# Patient Record
Sex: Female | Born: 1965 | Race: White | Hispanic: No | Marital: Married | Smoking: Former smoker
Health system: Southern US, Community
[De-identification: ages and names within clinical notes are randomized; demographics above are authoritative.]

## PROBLEM LIST (undated history)

## (undated) DIAGNOSIS — F329 Major depressive disorder, single episode, unspecified: Secondary | ICD-10-CM

## (undated) DIAGNOSIS — Z8041 Family history of malignant neoplasm of ovary: Secondary | ICD-10-CM

## (undated) DIAGNOSIS — Z8639 Personal history of other endocrine, nutritional and metabolic disease: Secondary | ICD-10-CM

## (undated) DIAGNOSIS — F32A Depression, unspecified: Secondary | ICD-10-CM

## (undated) DIAGNOSIS — K219 Gastro-esophageal reflux disease without esophagitis: Secondary | ICD-10-CM

## (undated) DIAGNOSIS — G43909 Migraine, unspecified, not intractable, without status migrainosus: Secondary | ICD-10-CM

## (undated) DIAGNOSIS — Z1371 Encounter for nonprocreative screening for genetic disease carrier status: Secondary | ICD-10-CM

## (undated) DIAGNOSIS — K589 Irritable bowel syndrome without diarrhea: Secondary | ICD-10-CM

## (undated) DIAGNOSIS — F419 Anxiety disorder, unspecified: Secondary | ICD-10-CM

## (undated) DIAGNOSIS — K259 Gastric ulcer, unspecified as acute or chronic, without hemorrhage or perforation: Secondary | ICD-10-CM

## (undated) DIAGNOSIS — K649 Unspecified hemorrhoids: Secondary | ICD-10-CM

## (undated) DIAGNOSIS — N920 Excessive and frequent menstruation with regular cycle: Secondary | ICD-10-CM

## (undated) DIAGNOSIS — D259 Leiomyoma of uterus, unspecified: Secondary | ICD-10-CM

## (undated) DIAGNOSIS — M5416 Radiculopathy, lumbar region: Secondary | ICD-10-CM

## (undated) HISTORY — PX: OOPHORECTOMY: SHX86

## (undated) HISTORY — DX: Radiculopathy, lumbar region: M54.16

## (undated) HISTORY — DX: Excessive and frequent menstruation with regular cycle: N92.0

## (undated) HISTORY — DX: Encounter for nonprocreative screening for genetic disease carrier status: Z13.71

## (undated) HISTORY — PX: COLONOSCOPY: SHX174

## (undated) HISTORY — DX: Irritable bowel syndrome, unspecified: K58.9

## (undated) HISTORY — DX: Family history of malignant neoplasm of ovary: Z80.41

## (undated) HISTORY — DX: Major depressive disorder, single episode, unspecified: F32.9

## (undated) HISTORY — DX: Personal history of other endocrine, nutritional and metabolic disease: Z86.39

## (undated) HISTORY — DX: Gastro-esophageal reflux disease without esophagitis: K21.9

## (undated) HISTORY — DX: Unspecified hemorrhoids: K64.9

## (undated) HISTORY — PX: ECTOPIC PREGNANCY SURGERY: SHX613

## (undated) HISTORY — DX: Leiomyoma of uterus, unspecified: D25.9

## (undated) HISTORY — DX: Depression, unspecified: F32.A

## (undated) HISTORY — PX: ABDOMINAL HYSTERECTOMY: SHX81

## (undated) HISTORY — DX: Migraine, unspecified, not intractable, without status migrainosus: G43.909

## (undated) HISTORY — DX: Anxiety disorder, unspecified: F41.9

## (undated) HISTORY — DX: Gastric ulcer, unspecified as acute or chronic, without hemorrhage or perforation: K25.9

---

## 2002-03-25 HISTORY — PX: TUBAL LIGATION: SHX77

## 2004-01-03 ENCOUNTER — Ambulatory Visit: Payer: Self-pay | Admitting: Gastroenterology

## 2007-03-12 ENCOUNTER — Ambulatory Visit: Payer: Self-pay

## 2007-03-16 ENCOUNTER — Emergency Department: Payer: Self-pay | Admitting: Internal Medicine

## 2007-05-03 ENCOUNTER — Ambulatory Visit: Payer: Self-pay | Admitting: Family Medicine

## 2007-06-05 ENCOUNTER — Ambulatory Visit: Payer: Self-pay | Admitting: Internal Medicine

## 2007-08-22 ENCOUNTER — Ambulatory Visit: Payer: Self-pay | Admitting: Family Medicine

## 2007-12-23 ENCOUNTER — Ambulatory Visit: Payer: Self-pay | Admitting: Family Medicine

## 2008-01-31 ENCOUNTER — Ambulatory Visit: Payer: Self-pay | Admitting: Family Medicine

## 2008-02-07 ENCOUNTER — Ambulatory Visit: Payer: Self-pay | Admitting: Family Medicine

## 2008-02-07 ENCOUNTER — Emergency Department: Payer: Self-pay | Admitting: Emergency Medicine

## 2008-03-25 HISTORY — PX: BACK SURGERY: SHX140

## 2008-03-25 HISTORY — PX: CHOLECYSTECTOMY: SHX55

## 2008-04-11 ENCOUNTER — Ambulatory Visit: Payer: Self-pay | Admitting: General Surgery

## 2008-04-18 ENCOUNTER — Ambulatory Visit: Payer: Self-pay | Admitting: General Surgery

## 2008-05-15 ENCOUNTER — Ambulatory Visit: Payer: Self-pay | Admitting: Internal Medicine

## 2008-11-25 ENCOUNTER — Ambulatory Visit: Payer: Self-pay | Admitting: Internal Medicine

## 2009-04-29 ENCOUNTER — Ambulatory Visit: Payer: Self-pay | Admitting: Internal Medicine

## 2009-05-02 ENCOUNTER — Ambulatory Visit: Payer: Self-pay | Admitting: Internal Medicine

## 2009-08-10 ENCOUNTER — Ambulatory Visit: Payer: Self-pay | Admitting: Internal Medicine

## 2009-09-15 ENCOUNTER — Ambulatory Visit: Payer: Self-pay | Admitting: Internal Medicine

## 2010-06-23 ENCOUNTER — Ambulatory Visit: Payer: Self-pay | Admitting: Internal Medicine

## 2012-02-08 ENCOUNTER — Ambulatory Visit: Payer: Self-pay | Admitting: Physician Assistant

## 2012-02-08 LAB — RAPID STREP-A WITH REFLX: Micro Text Report: NEGATIVE

## 2012-03-25 HISTORY — PX: TOTAL LAPAROSCOPIC HYSTERECTOMY WITH SALPINGECTOMY: SHX6742

## 2012-04-12 ENCOUNTER — Ambulatory Visit: Payer: Self-pay | Admitting: Physician Assistant

## 2012-04-12 LAB — RAPID STREP-A WITH REFLX: Micro Text Report: NEGATIVE

## 2012-04-12 LAB — RAPID INFLUENZA A&B ANTIGENS

## 2012-04-18 ENCOUNTER — Ambulatory Visit: Payer: Self-pay | Admitting: Emergency Medicine

## 2012-07-03 ENCOUNTER — Ambulatory Visit: Payer: Self-pay | Admitting: Family Medicine

## 2013-01-25 ENCOUNTER — Ambulatory Visit: Payer: Self-pay | Admitting: Obstetrics & Gynecology

## 2013-01-25 LAB — BASIC METABOLIC PANEL
Calcium, Total: 8.8 mg/dL (ref 8.5–10.1)
Co2: 29 mmol/L (ref 21–32)
EGFR (African American): >60
EGFR (Non-African Amer.): >60
Osmolality: 272 (ref 275–301)
Potassium: 3.8 mmol/L (ref 3.5–5.1)

## 2013-01-25 LAB — PROTIME-INR
INR: 1
Prothrombin Time: 13.7 secs (ref 11.5–14.7)

## 2013-01-25 LAB — CBC
HCT: 37.7 % (ref 35.0–47.0)
HGB: 12.9 g/dL (ref 12.0–16.0)
MCHC: 34.2 g/dL (ref 32.0–36.0)
Platelet: 332 10*3/uL (ref 150–440)
WBC: 8.7 10*3/uL (ref 3.6–11.0)

## 2013-01-25 LAB — PREGNANCY, URINE: Pregnancy Test, Urine: NEGATIVE m[IU]/mL

## 2013-01-25 LAB — APTT: Activated PTT: 30.9 secs (ref 23.6–35.9)

## 2013-02-09 ENCOUNTER — Ambulatory Visit: Payer: Self-pay | Admitting: Obstetrics & Gynecology

## 2013-02-10 LAB — PATHOLOGY REPORT

## 2013-05-02 ENCOUNTER — Ambulatory Visit: Payer: Self-pay | Admitting: Family Medicine

## 2014-04-06 DIAGNOSIS — IMO0002 Reserved for concepts with insufficient information to code with codable children: Secondary | ICD-10-CM | POA: Insufficient documentation

## 2014-04-06 DIAGNOSIS — G43909 Migraine, unspecified, not intractable, without status migrainosus: Secondary | ICD-10-CM

## 2014-04-06 DIAGNOSIS — G43709 Chronic migraine without aura, not intractable, without status migrainosus: Secondary | ICD-10-CM | POA: Insufficient documentation

## 2014-04-06 HISTORY — DX: Migraine, unspecified, not intractable, without status migrainosus: G43.909

## 2014-05-10 ENCOUNTER — Ambulatory Visit: Payer: Self-pay | Admitting: Obstetrics & Gynecology

## 2014-06-17 DIAGNOSIS — M5416 Radiculopathy, lumbar region: Secondary | ICD-10-CM

## 2014-06-17 HISTORY — DX: Radiculopathy, lumbar region: M54.16

## 2014-07-15 NOTE — Op Note (Signed)
PATIENT NAME:  Katrina Munoz, STOPKA MR#:  [id number] DATE OF BIRTH:  1965-04-22  DATE OF PROCEDURE:  02/09/2013  PREOPERATIVE DIAGNOSES: Fibroid uterus, menorrhagia, and family history of ovarian cancer.   POSTOPERATIVE DIAGNOSES: Fibroid uterus, menorrhagia, and family history of ovarian cancer.   PROCEDURES PERFORMED: Complete laparoscopic hysterectomy with bilateral salpingo-oophorectomy and lysis of adhesions.   SURGEON: Glean Salen, M.D.   ASSISTANT: Malachy Mood, M.D.   ANESTHESIA: General.   ESTIMATED BLOOD LOSS: 800 mL.   COMPLICATIONS: None.   FINDINGS: Fibroid uterus. Normal tubes and ovaries. Some adhesions of the omentum to anterior abdominal wall.   DISPOSITION: To recovery room stable.   TECHNIQUE: The patient is prepped and draped in the usual sterile fashion after adequate anesthesia is obtained in the dorsal lithotomy position. Foley catheter is inserted. A V-Care  device is placed on the uterus after sounding to 10 cm. Attention is then turned to the abdomen where a Veress needle is inserted through a 5 mm infraumbilical incision after Marcaine is used to anesthetize the skin. Veress needle placement is confirmed using the hanging drop technique and the abdomen is then insufflated with CO2 gas. A 5 mm trocar is then inserted under direct visualization with the laparoscope with no injuries or bleeding noted. The patient is placed in Trendelenburg positioning and the above-mentioned findings are visualized. An 11 mm trocar is placed in the right lower quadrant and a 5 mm trocar is placed in the left lower quadrant lateral to the inferior epigastric blood vessels with no injuries or bleeding noted.   The infundibulopelvic blood vessels and ligaments are carefully coagulated and cut using the Harmonic scalpel with excellent hemostasis noted. Adhesions of the omentum to the anterior abdominal wall are also dissected and freed using the Harmonic scalpel without significant  bleeding noted. The round ligaments are carefully coagulated and cut using the Harmonic scalpel and then the broad ligament is dissected down to the level of the uterine arteries which are carefully coagulated and cut using the bipolar cautery device. The bladder is somewhat adherent to the cervix and is carefully dissected using the Harmonic scalpel without injury. The ureters are identified and visualized during this dissection of the uterine arteries as well. Once this has been completed on both sides and the uterus is noted to be anemic, the V-Care device is identified at the cervicovaginal junction tenting through the tissues and a circumferential incision is made to excise the uterus and cervix completely. The uterus is then removed vaginally. The pelvic cavity is irrigated with aspiration of fluid and hemostasis obtained using electrocautery. The vaginal cuff is closed with an interrupted 0 Polysorb suture using the Endo Stitch device, 6 sutures are applied and bimanual examination reveals complete closure. Repeat visualization of the intra-abdominal cavity reveals excellent hemostasis with no apparent injury to bowel, bladder, ureter, or colon. The patient is leveled, gas is removed, and trocars are removed with closing of the right lower quadrant incision using a 4-0 Vicryl suture and all incisions using Dermabond.   Cystoscopy is performed with saline distention of the bladder with no apparent injury or laceration. There is bilateral ureteral flow suggesting patent ureters. Cystoscope is removed and Foley catheter is reinserted. The patient goes to the recovery room in stable condition. All sponge, instrument, and needle counts are correct at the conclusion of the case. ____________________________ R. Barnett Applebaum, MD rph:sb D: 02/09/2013 10:09:58 ET T: 02/09/2013 10:25:09 ET JOB#: [id number]  cc: Glean Salen, MD, <Dictator> Herbie Baltimore  Salli Quarry MD ELECTRONICALLY SIGNED 02/11/2013 4:54

## 2014-08-01 DIAGNOSIS — Z8639 Personal history of other endocrine, nutritional and metabolic disease: Secondary | ICD-10-CM

## 2014-08-01 HISTORY — DX: Personal history of other endocrine, nutritional and metabolic disease: Z86.39

## 2014-11-17 IMAGING — CR DG CHEST 2V
1 series · 2 of 2 positions shown · non-contrast
Comparison: none

REASON FOR EXAM: productive cough X 1 week
COMMENTS:

PROCEDURE:     MDR - MDR CHEST PA(OR AP) AND LATERAL  - April 18, 2012 [DATE]
RESULT:     The lungs are clear. The heart and pulmonary vessels are normal.
The bony and mediastinal structures are unremarkable. There is no effusion.
There is no pneumothorax or evidence of congestive failure.

[Series 1: pa · 0.17mm/px · 2 of 2 slices shown]
[im 1/2]
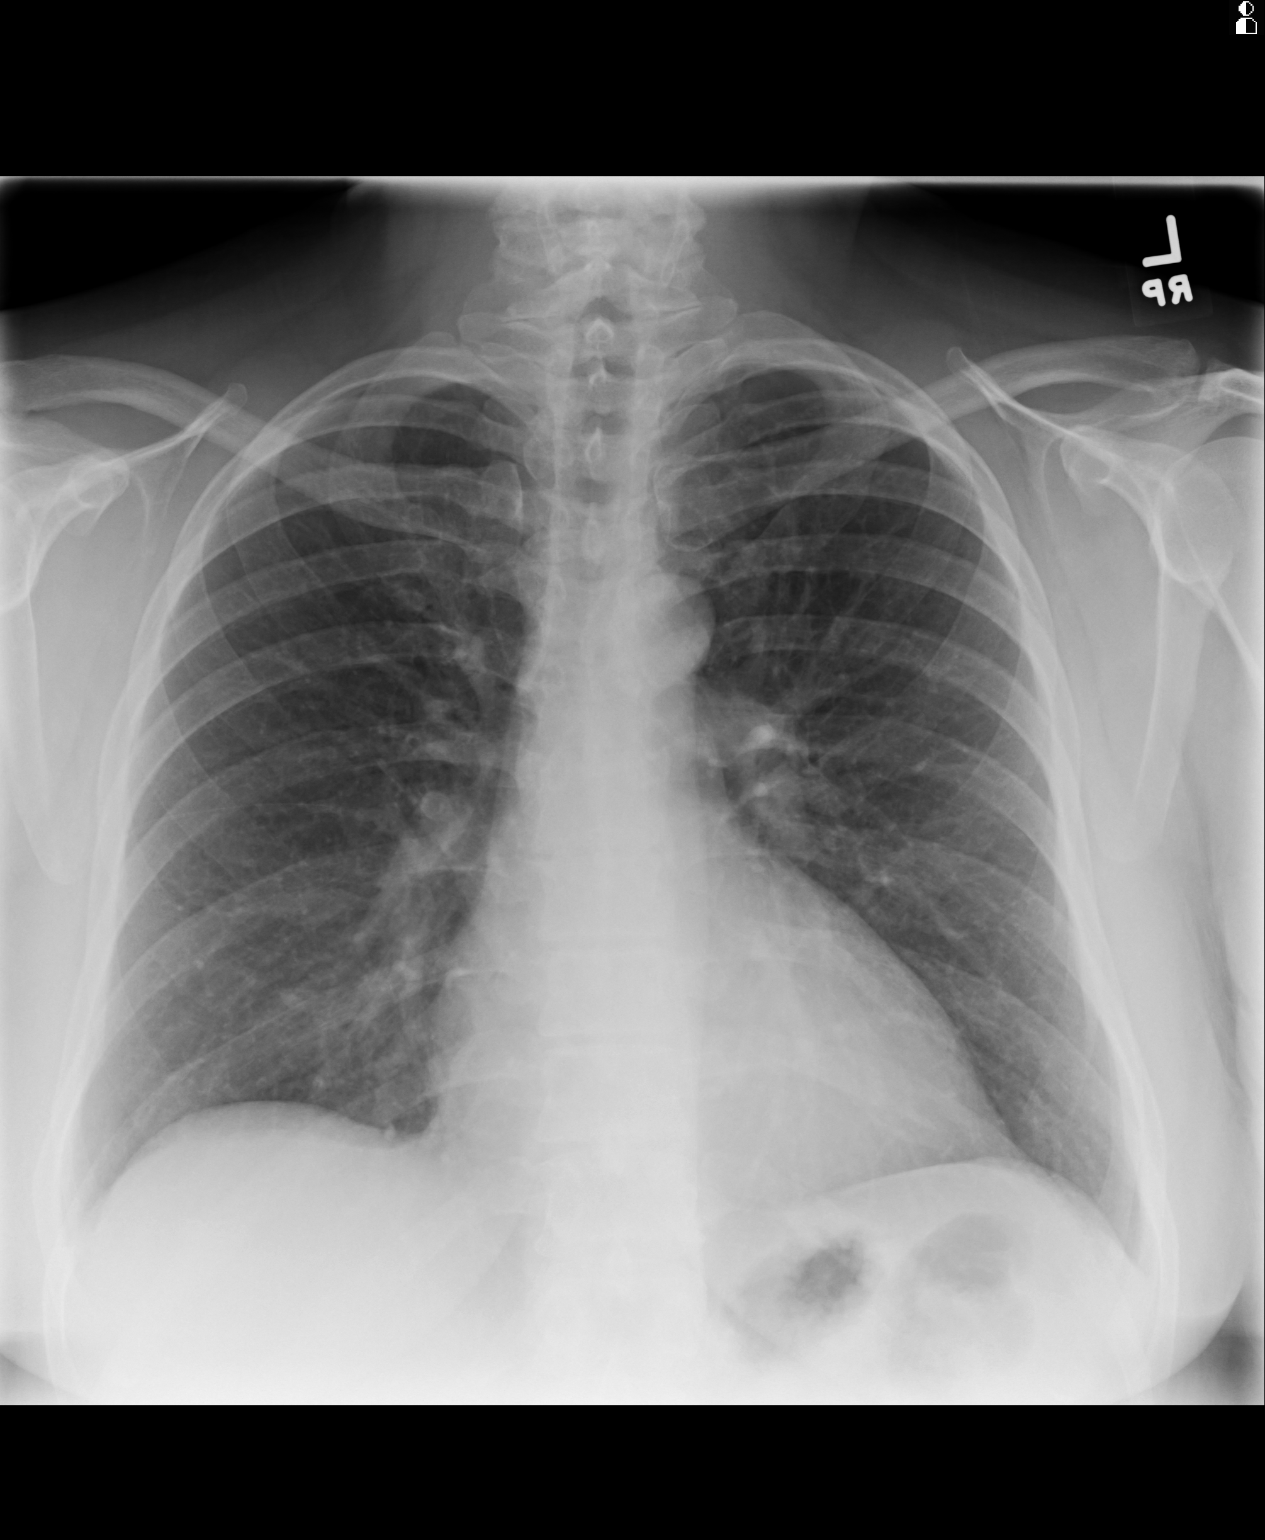
[im 2/2]
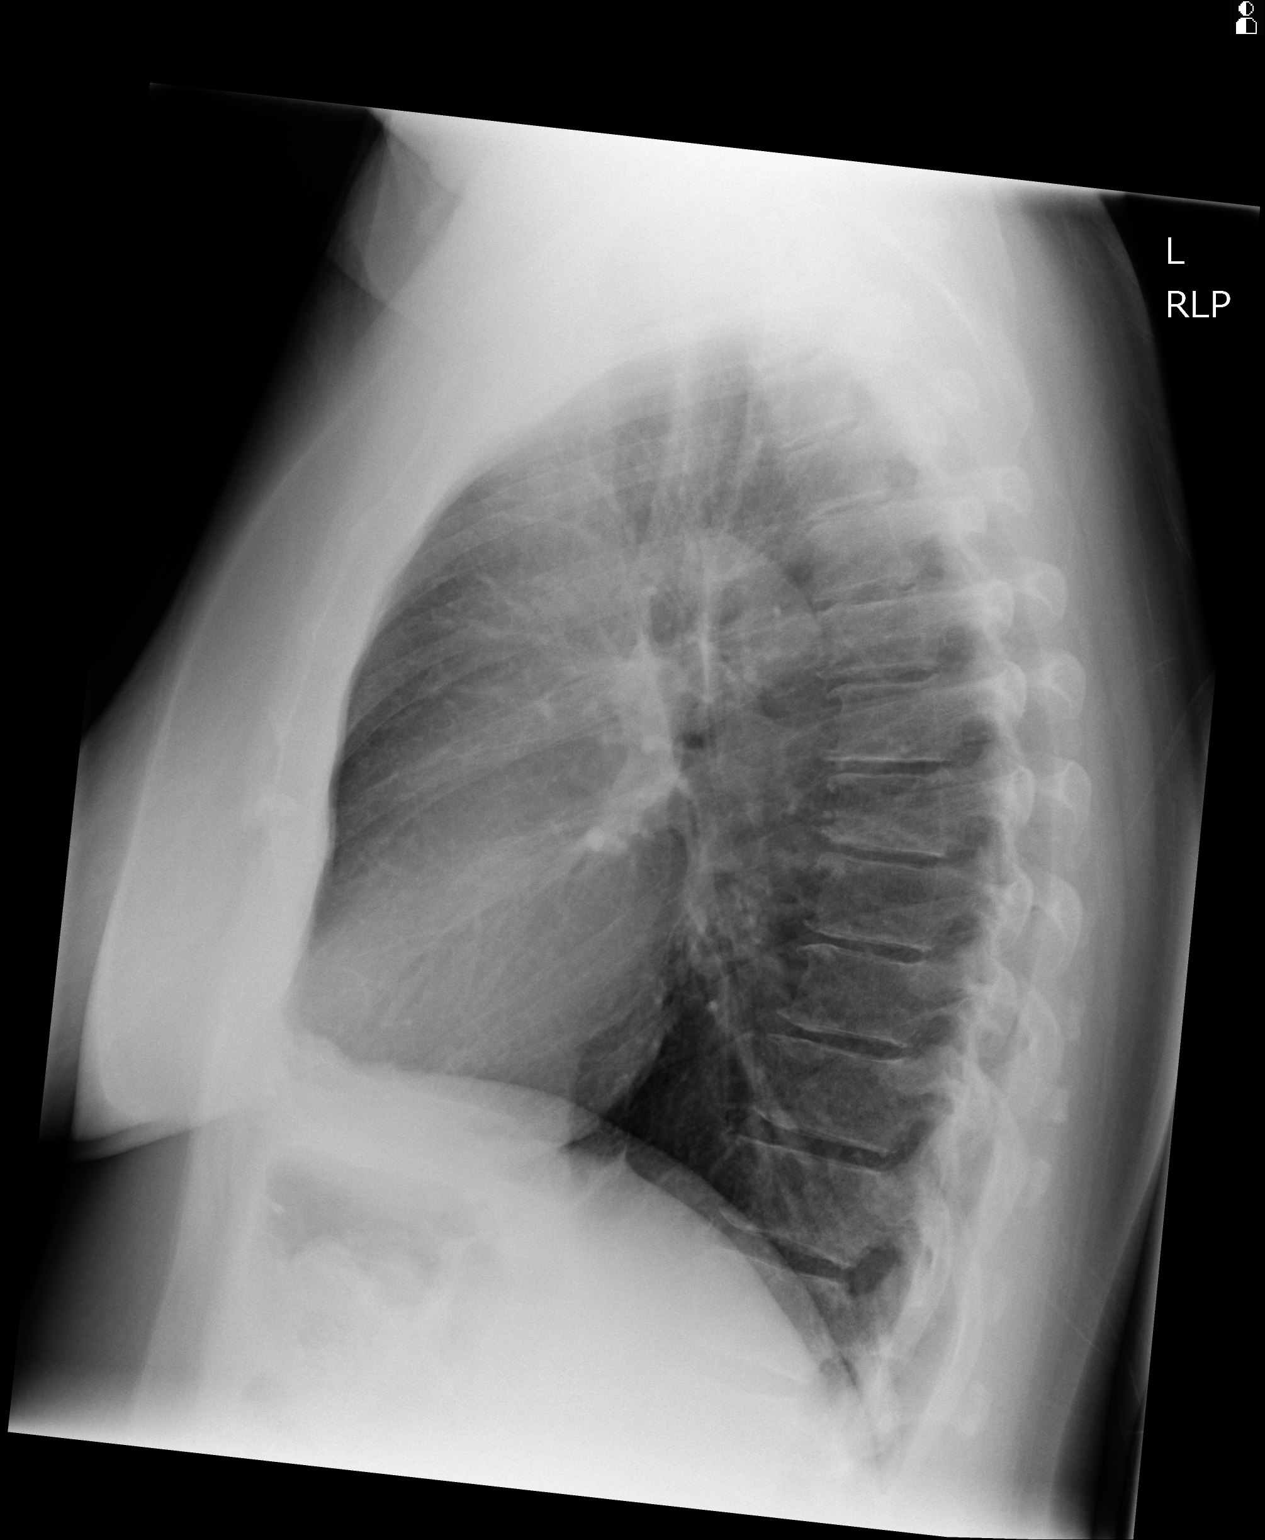

[2 of 2 positions shown; findings below may reference images not displayed]

IMPRESSION: No acute cardiopulmonary disease.

[REDACTED]

## 2015-05-08 ENCOUNTER — Other Ambulatory Visit: Payer: Self-pay | Admitting: Obstetrics & Gynecology

## 2015-05-08 DIAGNOSIS — Z1231 Encounter for screening mammogram for malignant neoplasm of breast: Secondary | ICD-10-CM

## 2015-05-19 ENCOUNTER — Ambulatory Visit: Payer: Self-pay | Admitting: Urology

## 2015-05-25 ENCOUNTER — Ambulatory Visit
Admission: RE | Admit: 2015-05-25 | Discharge: 2015-05-25 | Disposition: A | Discharge status: Home / Self Care | Payer: PRIVATE HEALTH INSURANCE | Source: Ambulatory Visit | Attending: Obstetrics & Gynecology | Admitting: Obstetrics & Gynecology

## 2015-05-25 DIAGNOSIS — Z1231 Encounter for screening mammogram for malignant neoplasm of breast: Secondary | ICD-10-CM | POA: Insufficient documentation

## 2015-05-25 LAB — HM MAMMOGRAPHY

## 2015-05-30 ENCOUNTER — Ambulatory Visit
Admission: RE | Admit: 2015-05-30 | Discharge: 2015-05-30 | Disposition: A | Discharge status: Home / Self Care | Payer: PRIVATE HEALTH INSURANCE | Source: Ambulatory Visit | Attending: Family Medicine | Admitting: Family Medicine

## 2015-05-30 ENCOUNTER — Other Ambulatory Visit: Payer: Self-pay | Admitting: Family Medicine

## 2015-05-30 DIAGNOSIS — M7989 Other specified soft tissue disorders: Secondary | ICD-10-CM | POA: Insufficient documentation

## 2015-05-30 DIAGNOSIS — M25571 Pain in right ankle and joints of right foot: Secondary | ICD-10-CM | POA: Diagnosis not present

## 2015-05-30 DIAGNOSIS — M19071 Primary osteoarthritis, right ankle and foot: Secondary | ICD-10-CM | POA: Insufficient documentation

## 2015-06-19 ENCOUNTER — Encounter: Payer: Self-pay | Admitting: Urology

## 2015-06-19 ENCOUNTER — Ambulatory Visit (INDEPENDENT_AMBULATORY_CARE_PROVIDER_SITE_OTHER): Payer: PRIVATE HEALTH INSURANCE | Admitting: Urology

## 2015-06-19 VITALS — BP 137/83 | HR 76 | Ht 62.0 in | Wt 229.0 lb

## 2015-06-19 DIAGNOSIS — R32 Unspecified urinary incontinence: Secondary | ICD-10-CM | POA: Diagnosis not present

## 2015-06-19 DIAGNOSIS — N393 Stress incontinence (female) (male): Secondary | ICD-10-CM | POA: Diagnosis not present

## 2015-06-19 LAB — URINALYSIS, COMPLETE
BILIRUBIN UA: NEGATIVE
GLUCOSE, UA: NEGATIVE
KETONES UA: NEGATIVE
LEUKOCYTES UA: NEGATIVE
Nitrite, UA: NEGATIVE
Protein, UA: NEGATIVE
RBC, UA: NEGATIVE
SPEC GRAV UA: 1.02 (ref 1.005–1.030)
Urobilinogen, Ur: 0.2 mg/dL (ref 0.2–1.0)
pH, UA: 6 (ref 5.0–7.5)

## 2015-06-19 LAB — MICROSCOPIC EXAMINATION: RBC MICROSCOPIC, UA: NONE SEEN /HPF (ref 0–?)

## 2015-06-19 LAB — BLADDER SCAN AMB NON-IMAGING

## 2015-06-19 NOTE — Progress Notes (Signed)
06/19/2015 11:42 AM   Katrina Munoz 25-Oct-1965 [id number]  Referring provider: Hortencia Pilar, MD [address] [place], [place] [address]  Chief Complaint  Patient presents with  . 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 She leaks with coughing and sneezing and sometimes bending and lifting. There is no question she can leak without awareness. She also has urgency incontinence and denies enuresis and is failed Kegel exercises and Miraberon. She really started Vesicare in its helping some. She wears 3-4 pads a day sometimes damp sometimes moderately wet  She gets up 2-3 times a night and voids every 2-3 hours during the daytime. She's had lower back surgery. Her flow was reasonable  She denies a history of previous GU surgery and she does not get recent urinary tract infections. She's had low back surgery graft she has had a hysterectomy and her bowel movements are normal  Modifying factors: There are no other modifying factors  Associated signs and symptoms: There are no other associated signs and symptoms Aggravating and relieving factors: There are no other aggravating or relieving factors Severity: Moderate Duration: Persistent     PMH: Past Medical History  Diagnosis Date  . Headache, migraine 04/06/2014  . Lumbar radiculopathy 06/17/2014    Overview:  S/p bilateral lumbar decompression L4-5 and L5-S1 s/p excision of large central/left disc herniation 09/16/08 with resolution of symptoms recurrent in 2016 with dx of L5-S1 radiculopathy per Dr. Elmer Sow  [place], [facility]. Report scanned.   . H/O nutritional disorder 08/01/2014  . GERD (gastroesophageal reflux disease)   . Anxiety and depression   . Stomach ulcer     Surgical History: Past Surgical History  Procedure Laterality Date  . Cholecystectomy  2010  . Back surgery  2010  . Cesarean section  2002  . Tubal ligation  2004   . Laparoscopic hysterectomy  2014  . Ectopic pregnancy surgery  2001, 2004    Home Medications:    Medication List       This list is accurate as of: 06/19/15 11:42 AM.  Always use your most recent med list.               estradiol 1 MG tablet  Commonly known as:  ESTRACE  TAKE 1 TABLET (1 MG) BY ORAL ROUTE ONCE DAILY     fluticasone 50 MCG/ACT nasal spray  Commonly known as:  FLONASE  USE 1 SPRAY IN EACH NOSTRIL EVERY DAY     HYDROcodone-acetaminophen 7.5-325 MG tablet  Commonly known as:  NORCO  Take 1 tablet by mouth every 6 (six) hours as needed. Reported on 06/19/2015     ibuprofen 600 MG tablet  Commonly known as:  ADVIL,MOTRIN  TAKE 1 TABLET (600 MG TOTAL) BY MOUTH EVERY 6 (SIX) HOURS AS NEEDED FOR PAIN FOR UP TO 10 DAYS.     IMITREX 50 MG tablet  Generic drug:  SUMAtriptan     topiramate 50 MG tablet  Commonly known as:  TOPAMAX  TAKE 1 TABLET (50 MG TOTAL) BY MOUTH ONCE DAILY.     VESICARE 10 MG tablet  Generic drug:  solifenacin  TAKE 1 TABLET (10 MG) BY ORAL ROUTE ONCE DAILY     Vitamin D 2000 units Caps  Take by mouth.        Allergies:  Allergies  Allergen Reactions  . Latex Rash  . Other Anaphylaxis  Uncoded Allergy. Allergen: DARVON Uncoded Allergy. Allergen: darvocet  . Shellfish Allergy Shortness Of Breath    Family History: Family History  Problem Relation Age of Onset  . Breast cancer Other   . Kidney failure Mother   . Bladder Cancer Neg Hx   . Kidney cancer Neg Hx   . Prostate cancer Neg Hx   . Ovarian cancer Mother     Social History:  reports that she has quit smoking. She does not have any smokeless tobacco history on file. She reports that she drinks alcohol. She reports that she does not use illicit drugs.  ROS: UROLOGY Frequent Urination?: Yes Hard to postpone urination?: Yes Burning/pain with urination?: No Get up at night to urinate?: Yes Leakage of urine?: Yes Urine stream starts and stops?: No Trouble  starting stream?: No Do you have to strain to urinate?: No Blood in urine?: No Urinary tract infection?: No Sexually transmitted disease?: No Injury to kidneys or bladder?: No Painful intercourse?: No Weak stream?: No Currently pregnant?: No Vaginal bleeding?: No Last menstrual period?: hysterectomy  Gastrointestinal Nausea?: No Vomiting?: No Indigestion/heartburn?: No Diarrhea?: No Constipation?: No  Constitutional Fever: No Night sweats?: No Weight loss?: No Fatigue?: No  Skin Skin rash/lesions?: No Itching?: No  Eyes Blurred vision?: No Double vision?: No  Ears/Nose/Throat Sore throat?: No Sinus problems?: No  Hematologic/Lymphatic Swollen glands?: No Easy bruising?: No  Cardiovascular Leg swelling?: No Chest pain?: No  Respiratory Cough?: No Shortness of breath?: No  Endocrine Excessive thirst?: No  Musculoskeletal Back pain?: No Joint pain?: No  Neurological Headaches?: No Dizziness?: No  Psychologic Depression?: No Anxiety?: No  Physical Exam: BP 137/83 mmHg  Pulse 76  Ht 5\' 2"  (1.575 m)  Wt 229 lb (103.874 kg)  BMI 41.87 kg/m2  Constitutional:  Alert and oriented, No acute distress. HEENT: [place] AT, moist mucus membranes.  Trachea midline, no masses. Cardiovascular: No clubbing, cyanosis, or edema. Respiratory: Normal respiratory effort, no increased work of breathing. GI: Abdomen is soft, nontender, nondistended, no abdominal masses GU: No CVA tenderness. Grade 2 hypermobility of the bladder neck and a positive cough test. Grade 1 rectocele Skin: No rashes, bruises or suspicious lesions. Lymph: No cervical or inguinal adenopathy. Neurologic: Grossly intact, no focal deficits, moving all 4 extremities. Psychiatric: Normal mood and affect.  Laboratory Data: Lab Results  Component Value Date   WBC 8.7 01/25/2013   HGB 9.4* 02/10/2013   HCT 37.7 01/25/2013   MCV 92 01/25/2013   PLT 332 01/25/2013    Lab Results  Component  Value Date   CREATININE 0.73 01/25/2013    Urinalysis No results found for: COLORURINE, APPEARANCEUR, LABSPEC, PHURINE, GLUCOSEU, HGBUR, BILIRUBINUR, KETONESUR, PROTEINUR, UROBILINOGEN, NITRITE, LEUKOCYTESUR  Pertinent Imaging: Bladder scan residual was 33 mL  Assessment & Plan:  The patient has mixed incontinence and also leaks without awareness. He has mild frequency and nocturia. She is a partial responder to [employer]  Role of urodynamics discuss  Urodynamics were ordered.  1. Urinary incontinence, unspecified incontinence type 2. Mixed stress and urge incontinence 3. Urinary frequency - Urinalysis, Complete - BLADDER SCAN AMB NON-IMAGING    Reece Packer, MD  [facility] [address], [place], [place] [address] [phone]

## 2015-07-28 ENCOUNTER — Other Ambulatory Visit: Payer: Self-pay | Admitting: Urology

## 2015-08-02 DIAGNOSIS — Z90710 Acquired absence of both cervix and uterus: Secondary | ICD-10-CM | POA: Insufficient documentation

## 2015-08-18 ENCOUNTER — Ambulatory Visit (INDEPENDENT_AMBULATORY_CARE_PROVIDER_SITE_OTHER): Payer: PRIVATE HEALTH INSURANCE | Admitting: Urology

## 2015-08-18 ENCOUNTER — Encounter: Payer: Self-pay | Admitting: Urology

## 2015-08-18 VITALS — BP 115/78 | HR 76 | Ht 62.0 in | Wt 229.8 lb

## 2015-08-18 DIAGNOSIS — N3946 Mixed incontinence: Secondary | ICD-10-CM

## 2015-08-18 MED ORDER — OXYBUTYNIN CHLORIDE ER 10 MG PO TB24: 10.0000 mg | ORAL_TABLET | Freq: Every day | ORAL | Status: DC

## 2015-08-18 NOTE — Progress Notes (Signed)
08/18/2015 10:49 AM   Katrina Munoz Jul 30, 1965 [id number]  Referring provider: Hortencia Pilar, MD [address] [place], [place] [address]  Chief Complaint  Patient presents with  . 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 She leaks with coughing and sneezing and sometimes bending and lifting. There is no question she can leak without awareness. She also has urgency incontinence and denies enuresis and is failed Kegel exercises and Miraberon. She really started Vesicare in its helping some. She wears 3-4 pads a day sometimes damp sometimes moderately wet  She gets up 2-3 times a night and voids every 2-3 hours during the daytime. She's had lower back surgery. Her flow was reasonable  She denies a history of previous GU surgery and she does not get recent urinary tract infections. She's had low back surgery graft she has had a hysterectomy and her bowel movements are normal  The last time the patient was here she had a grade 2 hypermobility of the bladder neck and a positive cough test. She had a grade 1 rectocele. Urodynamics were ordered.  On uroflow she voided 319 mL with a maximum flow 26 mils per second and she empty efficiently. Max capacity is 355 mL. Bladder was unstable reach a pressure 6 cm of water and she did not leak. At 200 mL she had mild leakage of 105 cm water. It was as low as 76 cm water at 100 mL. During voluntary voiding she voided 326 mils of maximal a 20 mils per second. Maximum voiding pressure 27 years water. She empty efficiently. EMG activity increased during the voiding phase. The details of the urine after signed dictated urodynamics sheet.     PMH: Past Medical History  Diagnosis Date  . Headache, migraine 04/06/2014  . Lumbar radiculopathy 06/17/2014    Overview:  S/p bilateral lumbar decompression L4-5 and L5-S1 s/p excision of large central/left  disc herniation 09/16/08 with resolution of symptoms recurrent in 2016 with dx of L5-S1 radiculopathy per Dr. Elmer Sow  [place], [facility] Report scanned.   . H/O nutritional disorder 08/01/2014  . GERD (gastroesophageal reflux disease)   . Anxiety and depression   . Stomach ulcer     Surgical History: Past Surgical History  Procedure Laterality Date  . Cholecystectomy  2010  . Back surgery  2010  . Cesarean section  2002  . Tubal ligation  2004  . Laparoscopic hysterectomy  2014  . Ectopic pregnancy surgery  2001, 2004    Home Medications:    Medication List       This list is accurate as of: 08/18/15 10:49 AM.  Always use your most recent med list.               estradiol 1 MG tablet  Commonly known as:  ESTRACE  TAKE 1 TABLET (1 MG) BY ORAL ROUTE ONCE DAILY     fluticasone 50 MCG/ACT nasal spray  Commonly known as:  FLONASE  USE 1 SPRAY IN EACH NOSTRIL EVERY DAY     IMITREX 50 MG tablet  Generic drug:  SUMAtriptan  Reported on 08/18/2015     promethazine 25 MG tablet  Commonly known as:  PHENERGAN  Reported on 08/18/2015     topiramate 50 MG tablet  Commonly known as:  TOPAMAX  TAKE 1 TABLET (50 MG TOTAL) BY MOUTH ONCE DAILY.     VENTOLIN HFA  108 (90 Base) MCG/ACT inhaler  Generic drug:  albuterol  Inhale into the lungs.     VESICARE 10 MG tablet  Generic drug:  solifenacin  TAKE 1 TABLET (10 MG) BY ORAL ROUTE ONCE DAILY     Vitamin D 2000 units Caps  Take by mouth.        Allergies:  Allergies  Allergen Reactions  . Latex Rash  . Other Anaphylaxis    Uncoded Allergy. Allergen: DARVON Uncoded Allergy. Allergen: darvocet  . Shellfish Allergy Shortness Of Breath    Family History: Family History  Problem Relation Age of Onset  . Breast cancer Other   . Kidney failure Mother   . Bladder Cancer Neg Hx   . Kidney cancer Neg Hx   . Prostate cancer Neg Hx   . Ovarian cancer Mother     Social History:  reports that she has quit smoking. She does  not have any smokeless tobacco history on file. She reports that she drinks alcohol. She reports that she does not use illicit drugs.  ROS: UROLOGY Frequent Urination?: No Hard to postpone urination?: No Burning/pain with urination?: No Get up at night to urinate?: Yes Leakage of urine?: Yes Urine stream starts and stops?: No Trouble starting stream?: No Do you have to strain to urinate?: No Blood in urine?: No Urinary tract infection?: No Sexually transmitted disease?: No Injury to kidneys or bladder?: No Painful intercourse?: Yes Weak stream?: No Currently pregnant?: No Vaginal bleeding?: No Last menstrual period?: hysterectomy  Gastrointestinal Nausea?: No Vomiting?: No Indigestion/heartburn?: Yes Diarrhea?: No Constipation?: No  Constitutional Fever: No Night sweats?: No Weight loss?: No Fatigue?: Yes  Skin Skin rash/lesions?: No Itching?: No  Eyes Blurred vision?: No Double vision?: No  Ears/Nose/Throat Sore throat?: No Sinus problems?: No  Hematologic/Lymphatic Swollen glands?: No Easy bruising?: No  Cardiovascular Leg swelling?: No Chest pain?: No  Respiratory Cough?: No Shortness of breath?: No  Endocrine Excessive thirst?: No  Musculoskeletal Back pain?: No Joint pain?: No  Neurological Headaches?: No Dizziness?: No  Psychologic Depression?: No Anxiety?: Yes  Physical Exam: BP 115/78 mmHg  Pulse 76  Ht 5\' 2"  (1.575 m)  Wt 229 lb 12.8 oz (104.237 kg)  BMI 42.02 kg/m2    Laboratory Data: Lab Results  Component Value Date   WBC 8.7 01/25/2013   HGB 9.4* 02/10/2013   HCT 37.7 01/25/2013   MCV 92 01/25/2013   PLT 332 01/25/2013    Lab Results  Component Value Date   CREATININE 0.73 01/25/2013    No results found for: PSA  No results found for: TESTOSTERONE  No results found for: HGBA1C  Urinalysis    Component Value Date/Time   APPEARANCEUR Clear 06/19/2015 1125   GLUCOSEU Negative 06/19/2015 1125    BILIRUBINUR Negative 06/19/2015 1125   PROTEINUR Negative 06/19/2015 1125   NITRITE Negative 06/19/2015 1125   LEUKOCYTESUR Negative 06/19/2015 1125    Pertinent Imaging: None  Assessment & Plan:  The patient is 20% better on Vesicare. In my opinion she is a moderate L abnormality and a moderate overactive bladder and I repeated the history again today. I think both are significant. I will call in oxybutynin ER 10 mg 3011. She does not reacher treatment all I will discuss a sling in detail and discussed the risk of persistent or worsening OAB following  There are no diagnoses linked to this encounter.  Return in about 4 weeks (around 09/15/2015).  Reece Packer, MD  [facility] [address]  [address] [place], [place] [address] [phone]

## 2015-09-11 ENCOUNTER — Encounter: Payer: Self-pay | Admitting: Urology

## 2015-09-11 ENCOUNTER — Ambulatory Visit (INDEPENDENT_AMBULATORY_CARE_PROVIDER_SITE_OTHER): Payer: PRIVATE HEALTH INSURANCE | Admitting: Urology

## 2015-09-11 VITALS — BP 107/72 | HR 69 | Ht 61.0 in | Wt 228.0 lb

## 2015-09-11 DIAGNOSIS — N393 Stress incontinence (female) (male): Secondary | ICD-10-CM | POA: Diagnosis not present

## 2015-09-11 DIAGNOSIS — R32 Unspecified urinary incontinence: Secondary | ICD-10-CM | POA: Diagnosis not present

## 2015-09-11 NOTE — Progress Notes (Signed)
09/11/2015 9:25 AM   Katrina Munoz [birth year] [id number]  Referring provider: Hortencia Pilar, MD [address] [place], [place] [address]  Chief Complaint  Patient presents with  . 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 She leaks with coughing and sneezing and sometimes bending and lifting. There is no question she can leak without awareness. She also has urgency incontinence and denies enuresis and is failed Kegel exercises and Miraberon. She really started Vesicare in its helping some. She wears 3-4 pads a day sometimes damp sometimes moderately wet  She gets up 2-3 times a night and voids every 2-3 hours during the daytime. She's had lower back surgery. Her flow was reasonable  She denies a history of previous GU surgery and she does not get recent urinary tract infections. She's had low back surgery graft she has had a hysterectomy and her bowel movements are normal  The last time the patient was here she had a grade 2 hypermobility of the bladder neck and a positive cough test. She had a grade 1 rectocele. Urodynamics were ordered.  On uroflow she voided 319 mL with a maximum flow 26 mils per second and she empty efficiently. Max capacity is 355 mL. Bladder was unstable reach a pressure 6 cm of water and she did not leak. At 200 mL she had mild leakage of 105 cm water. It was as low as 76 cm water at 100 mL. During voluntary voiding she voided 326 mils of maximal a 20 mils per second. Maximum voiding pressure 27 years water. She empty efficiently. EMG activity increased during the voiding phase. The details of the urine after signed dictated urodynamics sheet.  Assessment & Plan: The patient is 20% better on Vesicare. In my opinion she is a moderate outlet abnormality and a moderate overactive bladder and I repeated the history again today. I think both are significant. I will call in  oxybutynin ER 10 mg 3011. She does not reacher treatment all I will discuss a sling in detail and discussed the risk of persistent or worsening OAB following   Incontinence improved but still has nocturia and mixed incontinence. No UTI     PMH: Past Medical History  Diagnosis Date  . Headache, migraine 04/06/2014  . Lumbar radiculopathy 06/17/2014    Overview:  S/p bilateral lumbar decompression L4-5 and L5-S1 s/p excision of large central/left disc herniation 09/16/08 with resolution of symptoms recurrent in 2016 with dx of L5-S1 radiculopathy per Dr. Elmer Sow  [place], [facility]. Report scanned.   . H/O nutritional disorder 08/01/2014  . GERD (gastroesophageal reflux disease)   . Anxiety and depression   . Stomach ulcer     Surgical History: Past Surgical History  Procedure Laterality Date  . Cholecystectomy  2010  . Back surgery  2010  . Cesarean section  2002  . Tubal ligation  2004  . Laparoscopic hysterectomy  2014  . Ectopic pregnancy surgery  2001, 2004    Home Medications:    Medication List       This list is accurate as of: 09/11/15  9:25 AM.  Always use your most recent med list.               estradiol 1 MG tablet  Commonly known as:  ESTRACE  TAKE 1 TABLET (1 MG) BY ORAL ROUTE ONCE DAILY     fluticasone  50 MCG/ACT nasal spray  Commonly known as:  FLONASE  USE 1 SPRAY IN EACH NOSTRIL EVERY DAY     IMITREX 50 MG tablet  Generic drug:  SUMAtriptan  Reported on 08/18/2015     oxybutynin 10 MG 24 hr tablet  Commonly known as:  DITROPAN-XL  Take 1 tablet (10 mg total) by mouth daily.     promethazine 25 MG tablet  Commonly known as:  PHENERGAN  Reported on 08/18/2015     topiramate 50 MG tablet  Commonly known as:  TOPAMAX  TAKE 1 TABLET (50 MG TOTAL) BY MOUTH ONCE DAILY.     VENTOLIN HFA 108 (90 Base) MCG/ACT inhaler  Generic drug:  albuterol  Inhale into the lungs.     Vitamin D 2000 units Caps  Take by mouth.        Allergies:  Allergies    Allergen Reactions  . Latex Rash  . Other Anaphylaxis    Uncoded Allergy. Allergen: DARVON Uncoded Allergy. Allergen: darvocet  . Shellfish Allergy Shortness Of Breath    Family History: Family History  Problem Relation Age of Onset  . Breast cancer Other   . Kidney failure Mother   . Bladder Cancer Neg Hx   . Kidney cancer Neg Hx   . Prostate cancer Neg Hx   . Ovarian cancer Mother     Social History:  reports that she has quit smoking. She does not have any smokeless tobacco history on file. She reports that she drinks alcohol. She reports that she does not use illicit drugs.  ROS: UROLOGY Frequent Urination?: No Hard to postpone urination?: No Burning/pain with urination?: No Get up at night to urinate?: Yes Leakage of urine?: Yes Urine stream starts and stops?: No Trouble starting stream?: No Do you have to strain to urinate?: No Blood in urine?: No Urinary tract infection?: No Sexually transmitted disease?: No Injury to kidneys or bladder?: No Painful intercourse?: No Weak stream?: No Currently pregnant?: No Vaginal bleeding?: No Last menstrual period?: n  Gastrointestinal Nausea?: No Vomiting?: No Indigestion/heartburn?: No Diarrhea?: No Constipation?: No  Constitutional Fever: No Night sweats?: No Weight loss?: No Fatigue?: Yes  Skin Skin rash/lesions?: No Itching?: No  Eyes Blurred vision?: No Double vision?: No  Ears/Nose/Throat Sore throat?: No Sinus problems?: Yes  Hematologic/Lymphatic Swollen glands?: No Easy bruising?: No  Cardiovascular Leg swelling?: No Chest pain?: No  Respiratory Cough?: No Shortness of breath?: No  Endocrine Excessive thirst?: Yes  Musculoskeletal Back pain?: No Joint pain?: No  Neurological Headaches?: No Dizziness?: No  Psychologic Depression?: No Anxiety?: No  Physical Exam: BP 107/72 mmHg  Pulse 69  Ht 5\' 1"  (1.549 m)  Wt 228 lb (103.42 kg)  BMI 43.10 kg/m2    Laboratory  Data: Lab Results  Component Value Date   WBC 8.7 01/25/2013   HGB 9.4* 02/10/2013   HCT 37.7 01/25/2013   MCV 92 01/25/2013   PLT 332 01/25/2013    Lab Results  Component Value Date   CREATININE 0.73 01/25/2013     Urinalysis    Component Value Date/Time   APPEARANCEUR Clear 06/19/2015 1125   GLUCOSEU Negative 06/19/2015 1125   BILIRUBINUR Negative 06/19/2015 1125   PROTEINUR Negative 06/19/2015 1125   NITRITE Negative 06/19/2015 1125   LEUKOCYTESUR Negative 06/19/2015 1125    Pertinent Imaging: none  Assessment & Plan:  Discussed sling in detail with my usual template and diagram. Picture given to family. She may want surgery in September. See in 4  months. Call if wants surgery. SLing issues discussed  There are no diagnoses linked to this encounter.  No Follow-up on file.  Reece Packer, MD  [facility] [address], [place], [place] [address] [phone]

## 2015-12-18 ENCOUNTER — Ambulatory Visit: Payer: PRIVATE HEALTH INSURANCE

## 2015-12-20 ENCOUNTER — Ambulatory Visit: Payer: PRIVATE HEALTH INSURANCE | Admitting: Urology

## 2016-02-05 ENCOUNTER — Ambulatory Visit (INDEPENDENT_AMBULATORY_CARE_PROVIDER_SITE_OTHER): Payer: PRIVATE HEALTH INSURANCE | Admitting: Urology

## 2016-02-05 VITALS — BP 114/77 | HR 71 | Ht 62.0 in | Wt 222.0 lb

## 2016-02-05 DIAGNOSIS — N3946 Mixed incontinence: Secondary | ICD-10-CM

## 2016-02-05 MED ORDER — OXYBUTYNIN CHLORIDE ER 10 MG PO TB24: 10.0000 mg | ORAL_TABLET | Freq: Every day | ORAL | 11 refills | Status: DC

## 2016-02-05 NOTE — Progress Notes (Signed)
02/05/2016 12:06 PM   Katrina Munoz 1965/04/26 [id number]  Referring provider: Hortencia Pilar, MD [address] [place], [place] [address]  Chief Complaint  Patient presents with  . 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 She leaks with coughing and sneezing and sometimes bending and lifting. There is no question she can leak without awareness. She also has urgency incontinence and denies enuresis and is failed Kegel exercises and Miraberon. She really started Vesicare in its helping some. She wears 3-4 pads a day sometimes damp sometimes moderately wet  She gets up 2-3 times a night and voids every 2-3 hours during the daytime. She's had lower back surgery. Her flow was reasonable  The last time the patient was here she had a grade 2 hypermobility of the bladder neck and a positive cough test. She had a grade 1 rectocele. Urodynamics were ordered.  On urodynamics the patient had a reduced bladder capacity with bladder overactivity and a mild bladder outlet abnormality  Felt that she had a moderate overactive bladder with a mild to moderate outlet abnormality. Felt both were significant. I gave her oxybutynin last time. We then discussed the sling in detail with my usual template.  Today Frequency is stable and not infected         PMH: Past Medical History:  Diagnosis Date  . Anxiety and depression   . GERD (gastroesophageal reflux disease)   . H/O nutritional disorder 08/01/2014  . Headache, migraine 04/06/2014  . Lumbar radiculopathy 06/17/2014   Overview:  S/p bilateral lumbar decompression L4-5 and L5-S1 s/p excision of large central/left disc herniation 09/16/08 with resolution of symptoms recurrent in 2016 with dx of L5-S1 radiculopathy per Dr. Elmer Sow  [place], [facility]. Report scanned.   . Stomach ulcer     Surgical History: Past Surgical History:  Procedure  Laterality Date  . BACK SURGERY  2010  . CESAREAN SECTION  2002  . CHOLECYSTECTOMY  2010  . ECTOPIC PREGNANCY SURGERY  2001, 2004  . LAPAROSCOPIC HYSTERECTOMY  2014  . TUBAL LIGATION  2004    Home Medications:    Medication List       Accurate as of 02/05/16 12:06 PM. Always use your most recent med list.          estradiol 1 MG tablet Commonly known as:  ESTRACE TAKE 1 TABLET (1 MG) BY ORAL ROUTE ONCE DAILY   fluticasone 50 MCG/ACT nasal spray Commonly known as:  FLONASE USE 1 SPRAY IN EACH NOSTRIL EVERY DAY   IMITREX 50 MG tablet Generic drug:  SUMAtriptan Reported on 08/18/2015   oxybutynin 10 MG 24 hr tablet Commonly known as:  DITROPAN-XL Take 1 tablet (10 mg total) by mouth daily.   promethazine 25 MG tablet Commonly known as:  PHENERGAN Reported on 08/18/2015   topiramate 50 MG tablet Commonly known as:  TOPAMAX TAKE 1 TABLET (50 MG TOTAL) BY MOUTH ONCE DAILY.   VENTOLIN HFA 108 (90 Base) MCG/ACT inhaler Generic drug:  albuterol Inhale into the lungs.   Vitamin D 2000 units Caps Take by mouth.       Allergies:  Allergies  Allergen Reactions  . Latex Rash  . Other Anaphylaxis    Uncoded Allergy. Allergen: DARVON Uncoded Allergy. Allergen: darvocet  . Shellfish Allergy Shortness Of Breath    Family History: Family History  Problem Relation Age of Onset  .  Breast cancer Other   . Kidney failure Mother   . Bladder Cancer Neg Hx   . Kidney cancer Neg Hx   . Prostate cancer Neg Hx   . Ovarian cancer Mother     Social History:  reports that she has quit smoking. She does not have any smokeless tobacco history on file. She reports that she drinks alcohol. She reports that she does not use drugs.  ROS: UROLOGY Frequent Urination?: No Hard to postpone urination?: Yes Burning/pain with urination?: No Get up at night to urinate?: Yes Leakage of urine?: No Urine stream starts and stops?: No Trouble starting stream?: No Do you have to strain  to urinate?: No Blood in urine?: No Urinary tract infection?: No Sexually transmitted disease?: No Injury to kidneys or bladder?: No Painful intercourse?: No Weak stream?: No Currently pregnant?: No Vaginal bleeding?: No Last menstrual period?: n  Gastrointestinal Nausea?: No Vomiting?: No Indigestion/heartburn?: No Diarrhea?: No Constipation?: No  Constitutional Fever: No Night sweats?: No Weight loss?: No Fatigue?: No  Skin Skin rash/lesions?: No Itching?: No  Eyes Blurred vision?: No Double vision?: No  Ears/Nose/Throat Sore throat?: No Sinus problems?: Yes  Hematologic/Lymphatic Swollen glands?: No Easy bruising?: No  Cardiovascular Leg swelling?: No Chest pain?: No  Respiratory Cough?: No Shortness of breath?: No  Endocrine Excessive thirst?: No  Musculoskeletal Back pain?: No Joint pain?: Yes  Neurological Headaches?: No Dizziness?: No  Psychologic Depression?: No Anxiety?: No  Physical Exam: BP 114/77   Pulse 71   Ht 5\' 2"  (1.575 m)   Wt 222 lb (100.7 kg)   BMI 40.60 kg/m     Laboratory Data: Lab Results  Component Value Date   WBC 8.7 01/25/2013   HGB 9.4 (L) 02/10/2013   HCT 37.7 01/25/2013   MCV 92 01/25/2013   PLT 332 01/25/2013    Lab Results  Component Value Date   CREATININE 0.73 01/25/2013    No results found for: PSA  No results found for: TESTOSTERONE  No results found for: HGBA1C  Urinalysis    Component Value Date/Time   APPEARANCEUR Clear 06/19/2015 1125   GLUCOSEU Negative 06/19/2015 1125   BILIRUBINUR Negative 06/19/2015 1125   PROTEINUR Negative 06/19/2015 1125   NITRITE Negative 06/19/2015 1125   LEUKOCYTESUR Negative 06/19/2015 1125    Pertinent Imaging: none  Assessment & Plan:  The patient is almost dry. Sometimes she leaks without awareness. If she gets a bad cold she will have stress incontinence. She truly thinks he oxybutynin is helped a lot with less urgency and frequency. She  will have good days and bad days. The prescription was renewed areas I will see her in 1 year    There are no diagnoses linked to this encounter.  No Follow-up on file.  Reece Packer, MD  [facility] [address], [place], [place] [address] [phone]

## 2016-06-06 ENCOUNTER — Other Ambulatory Visit: Payer: Self-pay | Admitting: Obstetrics & Gynecology

## 2016-06-06 NOTE — Telephone Encounter (Signed)
Sch appt for annual

## 2016-06-06 NOTE — Telephone Encounter (Signed)
Pt aware and transferred to Clarise Cruz to schedule annual

## 2016-07-07 ENCOUNTER — Other Ambulatory Visit: Payer: Self-pay | Admitting: Obstetrics & Gynecology

## 2016-07-10 DIAGNOSIS — Z8041 Family history of malignant neoplasm of ovary: Secondary | ICD-10-CM

## 2016-07-11 ENCOUNTER — Ambulatory Visit (INDEPENDENT_AMBULATORY_CARE_PROVIDER_SITE_OTHER): Payer: PRIVATE HEALTH INSURANCE | Admitting: Obstetrics & Gynecology

## 2016-07-11 ENCOUNTER — Encounter: Payer: Self-pay | Admitting: Obstetrics & Gynecology

## 2016-07-11 VITALS — BP 110/70 | HR 63 | Ht 62.0 in | Wt 222.0 lb

## 2016-07-11 DIAGNOSIS — Z0000 Encounter for general adult medical examination without abnormal findings: Secondary | ICD-10-CM

## 2016-07-11 DIAGNOSIS — Z1239 Encounter for other screening for malignant neoplasm of breast: Secondary | ICD-10-CM

## 2016-07-11 DIAGNOSIS — Z8041 Family history of malignant neoplasm of ovary: Secondary | ICD-10-CM

## 2016-07-11 DIAGNOSIS — Z1231 Encounter for screening mammogram for malignant neoplasm of breast: Secondary | ICD-10-CM

## 2016-07-11 MED ORDER — ESTRADIOL 1 MG PO TABS: ORAL_TABLET | ORAL | 12 refills | Status: DC

## 2016-07-11 NOTE — Progress Notes (Signed)
HPI:      [name] is a 51 y.o. [id number] who LMP was in the past, she presents today for her annual examination.  The patient has no complaints today. The patient is sexually active. Herlast pap: was normal and 2015.  The patient does perform self breast exams.  There is notable family history of breast or ovarian cancer in her family. The patient is taking hormone replacement therapy. Patient denies post-menopausal vaginal bleeding.   The patient has regular exercise: yes. The patient denies current symptoms of depression.    GYN Hx: Last Colonoscopy:4 mos ago. Normal.  Last DEXA: never ago.    PMHx: Past Medical History:  Diagnosis Date  . Anxiety and depression   . GERD (gastroesophageal reflux disease)   . H/O nutritional disorder 08/01/2014  . Headache, migraine 04/06/2014  . Hemorrhoids   . Leiomyoma of uterus   . Lumbar radiculopathy 06/17/2014   Overview:  S/p bilateral lumbar decompression L4-5 and L5-S1 s/p excision of large central/left disc herniation 09/16/08 with resolution of symptoms recurrent in 2016 with dx of L5-S1 radiculopathy per Dr. Elmer Sow  [place], [facility]. Report scanned.   . Stomach ulcer    Past Surgical History:  Procedure Laterality Date  . BACK SURGERY  2010  . CESAREAN SECTION  2002  . CHOLECYSTECTOMY  2010  . COLONOSCOPY    . ECTOPIC PREGNANCY SURGERY  2001, 2004  . TOTAL LAPAROSCOPIC HYSTERECTOMY WITH SALPINGECTOMY  2014  . TUBAL LIGATION  2004   Family History  Problem Relation Age of Onset  . Breast cancer Other   . Kidney failure Mother   . Ovarian cancer Mother   . Bladder Cancer Neg Hx   . Kidney cancer Neg Hx   . Prostate cancer Neg Hx    Social History  Substance Use Topics  . Smoking status: Former Research scientist (life sciences)  . Smokeless tobacco: Never Used  . Alcohol use 0.0 oz/week     Comment: occasionally    Current Outpatient Prescriptions:  .  albuterol (VENTOLIN HFA) 108 (90 Base) MCG/ACT inhaler, Inhale into the lungs., Disp: , Rfl:   .  Cholecalciferol (VITAMIN D) 2000 units CAPS, Take by mouth., Disp: , Rfl:  .  estradiol (ESTRACE) 1 MG tablet, TAKE 1 TABLET (1 MG) BY ORAL ROUTE ONCE DAILY, Disp: 30 tablet, Rfl: 12 .  fluticasone (FLONASE) 50 MCG/ACT nasal spray, USE 1 SPRAY IN EACH NOSTRIL EVERY DAY, Disp: , Rfl: 12 .  oxybutynin (DITROPAN-XL) 10 MG 24 hr tablet, Take 1 tablet (10 mg total) by mouth daily., Disp: 30 tablet, Rfl: 11 .  promethazine (PHENERGAN) 25 MG tablet, Reported on 08/18/2015, Disp: , Rfl: 0 .  SUMAtriptan (IMITREX) 50 MG tablet, Reported on 08/18/2015, Disp: , Rfl:  .  topiramate (TOPAMAX) 50 MG tablet, TAKE 1 TABLET (50 MG TOTAL) BY MOUTH ONCE DAILY., Disp: , Rfl: 4 Allergies: Latex; Other; and Shellfish allergy  ROS  Objective: BP 110/70   Pulse 63   Ht 5\' 2"  (1.575 m)   Wt 222 lb (100.7 kg)   LMP 07/10/2012   BMI 40.60 kg/m   Filed Weights   07/11/16 0820  Weight: 222 lb (100.7 kg)   Body mass index is 40.6 kg/m. OBGyn Exam  Assessment: Annual Exam 1. Annual physical exam   2. Screening for breast cancer   3. Family history of ovarian cancer     Plan:            1.  Cervical  Screening-  Pap smear schedule reviewed with patient, due 2019  2. Breast screening- Exam annually and mammogram scheduled  3. Colonoscopy every 5 years due to polyps (done 01/2016), Hemoccult testing after age 24  4. Labs managed by PCP  5. Counseling for hormonal therapy: wants to cont HRT or dose due to good control  6. Bladder under good control, sees urology    F/U  Return in about 1 year (around 07/11/2017) for Annual.  Barnett Applebaum, MD, [facility], [facility] 07/11/2016  8:56 AM

## 2016-07-15 ENCOUNTER — Ambulatory Visit
Admission: RE | Admit: 2016-07-15 | Discharge: 2016-07-15 | Disposition: A | Discharge status: Home / Self Care | Payer: PRIVATE HEALTH INSURANCE | Source: Ambulatory Visit | Attending: Obstetrics & Gynecology | Admitting: Obstetrics & Gynecology

## 2016-07-15 DIAGNOSIS — Z1231 Encounter for screening mammogram for malignant neoplasm of breast: Secondary | ICD-10-CM | POA: Insufficient documentation

## 2016-07-15 DIAGNOSIS — Z1239 Encounter for other screening for malignant neoplasm of breast: Secondary | ICD-10-CM

## 2016-07-17 ENCOUNTER — Encounter: Payer: Self-pay | Admitting: Obstetrics and Gynecology

## 2016-08-01 DIAGNOSIS — K219 Gastro-esophageal reflux disease without esophagitis: Secondary | ICD-10-CM | POA: Insufficient documentation

## 2016-08-01 DIAGNOSIS — R739 Hyperglycemia, unspecified: Secondary | ICD-10-CM | POA: Insufficient documentation

## 2016-08-14 ENCOUNTER — Telehealth: Payer: Self-pay | Admitting: Urology

## 2016-08-14 NOTE — Telephone Encounter (Signed)
Spoke with pt in reference to medication. Made aware spoke with pharmacist and was able to fix things. Pt voiced understanding.

## 2016-08-14 NOTE — Telephone Encounter (Signed)
Pt called and says pharmacy will not refill rx that should be for 1 year for Oxybutynin 10 mg at [facility] [place].  MacDiarmid wrote rx for 1 year and they will not fill.  She only has 1 week left.

## 2016-08-26 DIAGNOSIS — I2089 Other forms of angina pectoris: Secondary | ICD-10-CM | POA: Insufficient documentation

## 2016-08-26 DIAGNOSIS — I208 Other forms of angina pectoris: Secondary | ICD-10-CM | POA: Insufficient documentation

## 2017-02-03 ENCOUNTER — Ambulatory Visit: Payer: PRIVATE HEALTH INSURANCE | Admitting: Urology

## 2017-02-03 ENCOUNTER — Encounter: Payer: Self-pay | Admitting: Urology

## 2017-02-03 DIAGNOSIS — N3946 Mixed incontinence: Secondary | ICD-10-CM

## 2017-02-03 MED ORDER — OXYBUTYNIN CHLORIDE ER 10 MG PO TB24: 10.0000 mg | ORAL_TABLET | Freq: Every day | ORAL | 11 refills | Status: DC

## 2017-02-03 MED ORDER — OXYBUTYNIN CHLORIDE ER 10 MG PO TB24: 10.0000 mg | ORAL_TABLET | Freq: Every day | ORAL | 2 refills | Status: DC

## 2017-02-03 NOTE — Progress Notes (Signed)
02/03/2017 11:26 AM   Glendale Chard [birth year] [id number]  Referring provider: Hortencia Pilar, MD [address] [place], [place] [address]  Chief Complaint  Patient presents with  . 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 She leaks with coughing and sneezing and sometimes bending and lifting. There is no question she can leak without awareness. She also has urgency incontinence and denies enuresis and is failed Kegel exercises and Miraberon. She really started Vesicare in its helping some. She wears 3-4 pads a day sometimes damp sometimes moderately wet  She gets up 2-3 times a night and voids every 2-3 hours during the daytime. She's had lower back surgery. Her flow was reasonable  The last time the patient was here she had a grade 2 hypermobility of the bladder neck and a positive cough test. She had a grade 1 rectocele. Urodynamics were ordered.  On urodynamics the patient had a reduced bladder capacity with bladder overactivity and a mild bladder outlet abnormality  Felt that she had a moderate overactive bladder with a mild to moderate outlet abnormality. Felt both were significant. I gave her oxybutynin last time. We then discussed the sling in detail with my usual template.  01/2016 The patient is almost dry. Sometimes she leaks without awareness. If she gets a bad cold she will have stress incontinence. She truly thinks he oxybutynin is helped a lot with less urgency and frequency. She will have good days and bad days. The prescription was renewed areas I will see her in 1 year  Today The patient is much better.  She only uses a light pad.  If she is sick she still has significant stress incontinence but does not want surgery.   PMH: Past Medical History:  Diagnosis Date  . Anxiety and depression   . Family history of ovarian cancer    genetic testing letter sent to pt 4/18  . GERD  (gastroesophageal reflux disease)   . H/O nutritional disorder 08/01/2014  . Headache, migraine 04/06/2014  . Hemorrhoids   . Leiomyoma of uterus   . Lumbar radiculopathy 06/17/2014   Overview:  S/p bilateral lumbar decompression L4-5 and L5-S1 s/p excision of large central/left disc herniation 09/16/08 with resolution of symptoms recurrent in 2016 with dx of L5-S1 radiculopathy per Dr. Elmer Sow  [place], [facility]. Report scanned.   . Stomach ulcer     Surgical History: Past Surgical History:  Procedure Laterality Date  . BACK SURGERY  2010  . CESAREAN SECTION  2002  . CHOLECYSTECTOMY  2010  . COLONOSCOPY    . ECTOPIC PREGNANCY SURGERY  2001, 2004  . TOTAL LAPAROSCOPIC HYSTERECTOMY WITH SALPINGECTOMY  2014  . TUBAL LIGATION  2004    Home Medications:  Allergies as of 02/03/2017      Reactions   Latex Rash   Other Anaphylaxis   Uncoded Allergy. Allergen: DARVON Uncoded Allergy. Allergen: darvocet   Shellfish Allergy Shortness Of Breath      Medication List        Accurate as of 02/03/17 11:26 AM. Always use your most recent med list.          estradiol 1 MG tablet Commonly known as:  ESTRACE TAKE 1 TABLET (1 MG) BY ORAL ROUTE ONCE DAILY   fluticasone 50 MCG/ACT nasal spray Commonly known as:  FLONASE USE 1 SPRAY IN EACH NOSTRIL EVERY DAY   IMITREX 50 MG tablet Generic drug:  SUMAtriptan Reported on 08/18/2015   oxybutynin 10 MG 24 hr tablet Commonly known as:  DITROPAN-XL Take 1 tablet (10 mg total) daily by mouth.   promethazine 25 MG tablet Commonly known as:  PHENERGAN Reported on 08/18/2015   topiramate 50 MG tablet Commonly known as:  TOPAMAX TAKE 1 TABLET (50 MG TOTAL) BY MOUTH ONCE DAILY.   VENTOLIN HFA 108 (90 Base) MCG/ACT inhaler Generic drug:  albuterol Inhale into the lungs.   Vitamin D 2000 units Caps Take by mouth.       Allergies:  Allergies  Allergen Reactions  . Latex Rash  . Other Anaphylaxis    Uncoded Allergy. Allergen:  DARVON Uncoded Allergy. Allergen: darvocet  . Shellfish Allergy Shortness Of Breath    Family History: Family History  Problem Relation Age of Onset  . Breast cancer Other   . Kidney failure Mother   . Ovarian cancer Mother 59  . Bladder Cancer Neg Hx   . Kidney cancer Neg Hx   . Prostate cancer Neg Hx     Social History:  reports that she has quit smoking. she has never used smokeless tobacco. She reports that she drinks alcohol. She reports that she does not use drugs.  ROS: UROLOGY Frequent Urination?: No Hard to postpone urination?: No Burning/pain with urination?: No Get up at night to urinate?: Yes Leakage of urine?: No Urine stream starts and stops?: No Trouble starting stream?: No Do you have to strain to urinate?: No Blood in urine?: No Urinary tract infection?: No Sexually transmitted disease?: No Injury to kidneys or bladder?: No Painful intercourse?: No Weak stream?: No Currently pregnant?: No Vaginal bleeding?: No Last menstrual period?: n  Gastrointestinal Nausea?: No Vomiting?: No Indigestion/heartburn?: No Diarrhea?: No Constipation?: No  Constitutional Fever: No Night sweats?: No Weight loss?: No Fatigue?: No  Skin Skin rash/lesions?: Yes Itching?: Yes  Eyes Blurred vision?: No Double vision?: No  Ears/Nose/Throat Sore throat?: No Sinus problems?: No  Hematologic/Lymphatic Swollen glands?: No Easy bruising?: No  Cardiovascular Leg swelling?: No Chest pain?: No  Respiratory Cough?: No Shortness of breath?: No  Endocrine Excessive thirst?: No  Musculoskeletal Back pain?: No Joint pain?: No  Neurological Headaches?: No Dizziness?: No  Psychologic Depression?: No Anxiety?: No  Physical Exam: BP 102/69 (BP Location: Right Arm, Patient Position: Sitting, Cuff Size: Normal)   Pulse 71   Ht 5\' 2"  (1.575 m)   Wt 194 lb 3.2 oz (88.1 kg)   LMP 07/10/2012   BMI 35.52 kg/m   Constitutional:  Alert and oriented, No  acute distress.   Laboratory Data: Lab Results  Component Value Date   WBC 8.7 01/25/2013   HGB 9.4 (L) 02/10/2013   HCT 37.7 01/25/2013   MCV 92 01/25/2013   PLT 332 01/25/2013    Lab Results  Component Value Date   CREATININE 0.73 01/25/2013    No results found for: PSA  No results found for: TESTOSTERONE  No results found for: HGBA1C  Urinalysis    Component Value Date/Time   APPEARANCEUR Clear 06/19/2015 1125   GLUCOSEU Negative 06/19/2015 1125   BILIRUBINUR Negative 06/19/2015 1125   PROTEINUR Negative 06/19/2015 1125   NITRITE Negative 06/19/2015 1125   LEUKOCYTESUR Negative 06/19/2015 1125    Pertinent Imaging: none  Assessment & Plan: Prescription renewed with oxybutynin at bedtime.  I will see her in 1 year  There are no diagnoses linked to this encounter.  No Follow-up on file.  Reece Packer, MD  [facility]  [address] [place], [place] [address] [phone]

## 2017-02-04 ENCOUNTER — Ambulatory Visit: Payer: PRIVATE HEALTH INSURANCE

## 2017-07-18 ENCOUNTER — Other Ambulatory Visit: Payer: Self-pay | Admitting: Obstetrics & Gynecology

## 2017-07-18 DIAGNOSIS — Z0000 Encounter for general adult medical examination without abnormal findings: Secondary | ICD-10-CM

## 2017-08-19 ENCOUNTER — Other Ambulatory Visit: Payer: Self-pay | Admitting: Obstetrics & Gynecology

## 2017-08-19 DIAGNOSIS — Z0000 Encounter for general adult medical examination without abnormal findings: Secondary | ICD-10-CM

## 2017-08-20 NOTE — Telephone Encounter (Signed)
Check on scheduling annual

## 2017-08-20 NOTE — Telephone Encounter (Signed)
Called and left voice mail for patient to call back to be schedule °

## 2017-09-02 ENCOUNTER — Encounter: Payer: Self-pay | Admitting: Obstetrics & Gynecology

## 2017-09-04 ENCOUNTER — Ambulatory Visit (INDEPENDENT_AMBULATORY_CARE_PROVIDER_SITE_OTHER): Payer: PRIVATE HEALTH INSURANCE | Admitting: Obstetrics & Gynecology

## 2017-09-04 ENCOUNTER — Encounter: Payer: Self-pay | Admitting: Obstetrics & Gynecology

## 2017-09-04 VITALS — BP 120/80 | Ht 62.0 in | Wt 200.0 lb

## 2017-09-04 DIAGNOSIS — Z131 Encounter for screening for diabetes mellitus: Secondary | ICD-10-CM

## 2017-09-04 DIAGNOSIS — Z1329 Encounter for screening for other suspected endocrine disorder: Secondary | ICD-10-CM

## 2017-09-04 DIAGNOSIS — Z1321 Encounter for screening for nutritional disorder: Secondary | ICD-10-CM

## 2017-09-04 DIAGNOSIS — Z1231 Encounter for screening mammogram for malignant neoplasm of breast: Secondary | ICD-10-CM | POA: Diagnosis not present

## 2017-09-04 DIAGNOSIS — Z90710 Acquired absence of both cervix and uterus: Secondary | ICD-10-CM | POA: Diagnosis not present

## 2017-09-04 DIAGNOSIS — Z1272 Encounter for screening for malignant neoplasm of vagina: Secondary | ICD-10-CM

## 2017-09-04 DIAGNOSIS — Z0000 Encounter for general adult medical examination without abnormal findings: Secondary | ICD-10-CM

## 2017-09-04 DIAGNOSIS — Z1239 Encounter for other screening for malignant neoplasm of breast: Secondary | ICD-10-CM

## 2017-09-04 DIAGNOSIS — Z13220 Encounter for screening for lipoid disorders: Secondary | ICD-10-CM

## 2017-09-04 MED ORDER — ESTRADIOL 1 MG PO TABS: 1.0000 mg | ORAL_TABLET | Freq: Every day | ORAL | 12 refills | Status: DC

## 2017-09-04 MED ORDER — ESTRADIOL 1 MG PO TABS: 1.0000 mg | ORAL_TABLET | Freq: Every day | ORAL | 3 refills | Status: DC

## 2017-09-04 NOTE — Progress Notes (Signed)
HPI:      [name] is a 52 y.o. [id number] who LMP was in the past after hysterectomy (complete) 2014, she presents today for her annual examination.  The patient has no complaints today. The patient is sexually active. Herlast pap: approximate date 2014 and was normal and last mammogram: approximate date 2018 and was normal.  The patient does perform self breast exams.  There is notable family history of breast or ovarian cancer in her family. The patient is taking hormone replacement therapy. Patient denies post-menopausal vaginal bleeding.   The patient has regular exercise: yes. The patient denies current symptoms of depression.    GYN Hx: Last Colonoscopy:1 year ago. Normal.  Last DEXA: never ago.    PMHx: Past Medical History:  Diagnosis Date  . Anxiety and depression   . Family history of ovarian cancer    genetic testing letter sent to pt 4/18  . GERD (gastroesophageal reflux disease)   . H/O nutritional disorder 08/01/2014  . Headache, migraine 04/06/2014  . Hemorrhoids   . IBS (irritable bowel syndrome)   . Leiomyoma of uterus   . Lumbar radiculopathy 06/17/2014   Overview:  S/p bilateral lumbar decompression L4-5 and L5-S1 s/p excision of large central/left disc herniation 09/16/08 with resolution of symptoms recurrent in 2016 with dx of L5-S1 radiculopathy per Dr. Elmer Sow  [place], [facility]. Report scanned.   . Menorrhagia   . Stomach ulcer    Past Surgical History:  Procedure Laterality Date  . BACK SURGERY  2010  . CESAREAN SECTION  2002  . CHOLECYSTECTOMY  2010  . COLONOSCOPY    . ECTOPIC PREGNANCY SURGERY  2001, 2004  . TOTAL LAPAROSCOPIC HYSTERECTOMY WITH SALPINGECTOMY  2014  . TUBAL LIGATION  2004   Family History  Problem Relation Age of Onset  . Breast cancer Other   . Kidney failure Mother   . Ovarian cancer Mother 52  . Bladder Cancer Neg Hx   . Kidney cancer Neg Hx   . Prostate cancer Neg Hx    Social History   Tobacco Use  . Smoking status:  Former Research scientist (life sciences)  . Smokeless tobacco: Never Used  Substance Use Topics  . Alcohol use: Yes    Alcohol/week: 0.0 oz    Comment: occasionally  . Drug use: No    Current Outpatient Medications:  .  Cholecalciferol (VITAMIN D) 2000 units CAPS, Take by mouth., Disp: , Rfl:  .  estradiol (ESTRACE) 1 MG tablet, Take 1 tablet (1 mg total) by mouth daily., Disp: 90 tablet, Rfl: 3 .  fluticasone (FLONASE) 50 MCG/ACT nasal spray, USE 1 SPRAY IN EACH NOSTRIL EVERY DAY, Disp: , Rfl: 12 .  oxybutynin (DITROPAN-XL) 10 MG 24 hr tablet, Take 1 tablet (10 mg total) daily by mouth., Disp: 30 tablet, Rfl: 11 .  SUMAtriptan (IMITREX) 50 MG tablet, Reported on 08/18/2015, Disp: , Rfl:  .  topiramate (TOPAMAX) 50 MG tablet, TAKE 1 TABLET (50 MG TOTAL) BY MOUTH ONCE DAILY., Disp: , Rfl: 4 .  albuterol (VENTOLIN HFA) 108 (90 Base) MCG/ACT inhaler, Inhale into the lungs., Disp: , Rfl:  .  promethazine (PHENERGAN) 25 MG tablet, Reported on 08/18/2015, Disp: , Rfl: 0 Allergies: Latex; Other; and Shellfish allergy  Review of Systems  Constitutional: Negative for chills, fever and malaise/fatigue.  HENT: Negative for congestion, sinus pain and sore throat.   Eyes: Negative for blurred vision and pain.  Respiratory: Negative for cough and wheezing.   Cardiovascular: Negative for chest  pain and leg swelling.  Gastrointestinal: Negative for abdominal pain, constipation, diarrhea, heartburn, nausea and vomiting.  Genitourinary: Negative for dysuria, frequency, hematuria and urgency.  Musculoskeletal: Negative for back pain, joint pain, myalgias and neck pain.  Skin: Negative for itching and rash.  Neurological: Negative for dizziness, tremors and weakness.  Endo/Heme/Allergies: Does not bruise/bleed easily.  Psychiatric/Behavioral: Negative for depression. The patient is not nervous/anxious and does not have insomnia.     Objective: BP 120/80   Ht 5\' 2"  (1.575 m)   Wt 200 lb (90.7 kg)   LMP 07/10/2012   BMI 36.58  kg/m   Filed Weights   09/04/17 0903  Weight: 200 lb (90.7 kg)   Body mass index is 36.58 kg/m. Physical Exam  Constitutional: She is oriented to person, place, and time. She appears well-developed and well-nourished. No distress.  Genitourinary: Rectum normal and vagina normal. Pelvic exam was performed with patient supine. There is no rash or lesion on the right labia. There is no rash or lesion on the left labia. Vagina exhibits no lesion. No bleeding in the vagina. Right adnexum does not display mass and does not display tenderness. Left adnexum does not display mass and does not display tenderness.  Genitourinary Comments: Absent Uterus Absent cervix Vaginal cuff well healed  HENT:  Head: Normocephalic and atraumatic. Head is without laceration.  Right Ear: Hearing normal.  Left Ear: Hearing normal.  Nose: No epistaxis.  No foreign bodies.  Mouth/Throat: Uvula is midline, oropharynx is clear and moist and mucous membranes are normal.  Eyes: Pupils are equal, round, and reactive to light.  Neck: Normal range of motion. Neck supple. No thyromegaly present.  Cardiovascular: Normal rate and regular rhythm. Exam reveals no gallop and no friction rub.  No murmur heard. Pulmonary/Chest: Effort normal and breath sounds normal. No respiratory distress. She has no wheezes. Right breast exhibits no mass, no skin change and no tenderness. Left breast exhibits no mass, no skin change and no tenderness.  Abdominal: Soft. Bowel sounds are normal. She exhibits no distension. There is no tenderness. There is no rebound.  Musculoskeletal: Normal range of motion.  Neurological: She is alert and oriented to person, place, and time. No cranial nerve deficit.  Skin: Skin is warm and dry.  Psychiatric: She has a normal mood and affect. Judgment normal.  Vitals reviewed.   Assessment: Annual Exam 1. Annual physical exam   2. Screening for vaginal cancer   3. S/P hysterectomy   4. Screening for  breast cancer   5. Encounter for vitamin deficiency screening   6. Screening for diabetes mellitus   7. Screening for cholesterol level   8. Screening for thyroid disorder     Plan:            1.  Cervical Screening-  Pap smear done today  2. Breast screening- Exam annually and mammogram scheduled  3. Colonoscopy every 10 years, Hemoccult testing after age 36  4. Labs Ordered today  5. Counseling for hormonal therapy: no change in therapy today Cont Estrace. Consider stopping in future.     F/U  Return in about 1 year (around 09/05/2018) for Annual.  Barnett Applebaum, MD, [facility], [facility] 09/04/2017  9:30 AM

## 2017-09-04 NOTE — Patient Instructions (Signed)
PAP every 5 years Mammogram every year    Call [phone] to schedule at [facility] Colonoscopy every 10 years Labs yearly (with PCP) 

## 2017-09-05 LAB — LIPID PANEL
CHOL/HDL RATIO: 3.1 ratio (ref 0.0–4.4)
Cholesterol, Total: 182 mg/dL (ref 100–199)
HDL: 58 mg/dL (ref >39)
LDL Calculated: 99 mg/dL (ref 0–99)
Triglycerides: 125 mg/dL (ref 0–149)
VLDL Cholesterol Cal: 25 mg/dL (ref 5–40)

## 2017-09-05 LAB — VITAMIN D 25 HYDROXY (VIT D DEFICIENCY, FRACTURES): VIT D 25 HYDROXY: 44.5 ng/mL (ref 30.0–100.0)

## 2017-09-05 LAB — HEMOGLOBIN A1C
ESTIMATED AVERAGE GLUCOSE: 100 mg/dL
HEMOGLOBIN A1C: 5.1 % (ref 4.8–5.6)

## 2017-09-05 LAB — TSH: TSH: 1.77 u[IU]/mL (ref 0.450–4.500)

## 2017-09-08 LAB — PAP IG (IMAGE GUIDED): PAP SMEAR COMMENT: 0

## 2017-09-12 ENCOUNTER — Encounter: Payer: Self-pay | Admitting: Obstetrics and Gynecology

## 2017-09-30 ENCOUNTER — Telehealth: Payer: Self-pay | Admitting: Obstetrics & Gynecology

## 2017-09-30 NOTE — Telephone Encounter (Signed)
Questions answered, Pt is on lab schedule for [facility] labwork for 7/24 '@9'$ :00. KJ CMA

## 2017-09-30 NOTE — Telephone Encounter (Signed)
Patient received a letter from [place] stating she would qualify for genetic testing and would be covered by insurance.  Patient has questions about this.

## 2017-10-07 ENCOUNTER — Ambulatory Visit
Admission: RE | Admit: 2017-10-07 | Discharge: 2017-10-07 | Disposition: A | Discharge status: Home / Self Care | Payer: PRIVATE HEALTH INSURANCE | Source: Ambulatory Visit | Attending: Obstetrics & Gynecology | Admitting: Obstetrics & Gynecology

## 2017-10-07 DIAGNOSIS — Z1231 Encounter for screening mammogram for malignant neoplasm of breast: Secondary | ICD-10-CM | POA: Insufficient documentation

## 2017-10-07 DIAGNOSIS — Z1239 Encounter for other screening for malignant neoplasm of breast: Secondary | ICD-10-CM

## 2017-10-15 ENCOUNTER — Other Ambulatory Visit: Payer: PRIVATE HEALTH INSURANCE

## 2017-10-23 DIAGNOSIS — Z1371 Encounter for nonprocreative screening for genetic disease carrier status: Secondary | ICD-10-CM

## 2017-10-23 HISTORY — DX: Encounter for nonprocreative screening for genetic disease carrier status: Z13.71

## 2017-11-04 ENCOUNTER — Encounter: Payer: Self-pay | Admitting: Obstetrics and Gynecology

## 2017-11-06 ENCOUNTER — Other Ambulatory Visit: Payer: Self-pay | Admitting: Obstetrics & Gynecology

## 2017-11-06 NOTE — Progress Notes (Signed)
Follow up discussion (phone) regarding recent genetic testing due to familial high risk factors.  Her lab genetic panel was negative.  Reassurance provided and counseling as to the pros and cons of testing was done today.  Since the current test is not perfect, it is possible that there may be a gene mutation that current testing cannot detect, but that chance is small. It is possible that a different genetic factor, which has not yet been discovered or is not on this panel, is responsible for the cancer diagnoses in the family. Again, the likelihood of this is low.   Most cancers happen by chance and this test, along with details of her family history, suggests that her cancer falls into this category. She is recommended to follow the cancer screening guidelines provided by her physician.  Breast cancer risk is further calculated based on the T-C model.  Even with negative gene testing, she still has a risk of 11% based on this calculation.  Recommendations are for yearly breast exams, mammograms, and MRI when this risk is > 20%.    As she has already had her ovaries removed, no further surveillance is recommended.  Barnett Applebaum, MD, [facility], [facility] 11/06/2017  8:02 AM

## 2017-12-27 ENCOUNTER — Ambulatory Visit (INDEPENDENT_AMBULATORY_CARE_PROVIDER_SITE_OTHER): Payer: PRIVATE HEALTH INSURANCE

## 2017-12-27 ENCOUNTER — Other Ambulatory Visit: Payer: Self-pay

## 2017-12-27 ENCOUNTER — Ambulatory Visit
Admission: EM | Admit: 2017-12-27 | Discharge: 2017-12-27 | Disposition: A | Discharge status: Home / Self Care | Payer: PRIVATE HEALTH INSURANCE | Attending: Family Medicine | Admitting: Family Medicine

## 2017-12-27 ENCOUNTER — Encounter: Payer: Self-pay | Admitting: Gynecology

## 2017-12-27 DIAGNOSIS — B9789 Other viral agents as the cause of diseases classified elsewhere: Secondary | ICD-10-CM | POA: Diagnosis not present

## 2017-12-27 DIAGNOSIS — J069 Acute upper respiratory infection, unspecified: Secondary | ICD-10-CM | POA: Diagnosis not present

## 2017-12-27 DIAGNOSIS — M940 Chondrocostal junction syndrome [Tietze]: Secondary | ICD-10-CM | POA: Diagnosis not present

## 2017-12-27 MED ORDER — HYDROCOD POLST-CPM POLST ER 10-8 MG/5ML PO SUER: 5.0000 mL | Freq: Two times a day (BID) | ORAL | 0 refills | Status: DC | PRN

## 2017-12-27 NOTE — ED Triage Notes (Signed)
Per patient with cough x 4 days after visiting the [place]. Per patient with shoulder pain rotating to her chest region x last night.

## 2017-12-27 NOTE — ED Provider Notes (Addendum)
[facility]    CSN: [id number] Arrival date & time: 12/27/17  5427     History   Chief Complaint Chief Complaint  Patient presents with  . Cough    HPI Katrina Munoz is a 52 y.o. female.   The history is provided by the patient.  URI  Presenting symptoms: congestion, cough and rhinorrhea   Severity:  Moderate Onset quality:  Sudden Duration:  4 days Timing:  Constant Progression:  Worsening Chronicity:  New Relieved by:  Nothing Ineffective treatments: ibuprofen. Associated symptoms: no wheezing   Associated symptoms comment:  Right sided mid and upper back pain radiating to the right shoulder worse with deep breaths Risk factors: sick contacts   Risk factors: not elderly, no chronic cardiac disease, no chronic kidney disease, no chronic respiratory disease, no diabetes mellitus, no immunosuppression, no recent illness and no recent travel     Past Medical History:  Diagnosis Date  . Anxiety and depression   . BRCA negative 10/2017   MyRisk neg except CDKN2A VUS; IBIS=9.3%  . Family history of ovarian cancer    MyRisk neg  . GERD (gastroesophageal reflux disease)   . H/O nutritional disorder 08/01/2014  . Headache, migraine 04/06/2014  . Hemorrhoids   . IBS (irritable bowel syndrome)   . Leiomyoma of uterus   . Lumbar radiculopathy 06/17/2014   Overview:  S/p bilateral lumbar decompression L4-5 and L5-S1 s/p excision of large central/left disc herniation 09/16/08 with resolution of symptoms recurrent in 2016 with dx of L5-S1 radiculopathy per Dr. Elmer Sow  [place], [facility]. Report scanned.   . Menorrhagia   . Stomach ulcer     Patient Active Problem List   Diagnosis Date Noted  . Family history of ovarian cancer 07/10/2016  . Incontinence 09/11/2015  . Stress incontinence, female 09/11/2015  . S/P hysterectomy 08/02/2015  . H/O nutritional disorder 08/01/2014  . Chronic lumbar radiculopathy 06/17/2014  . Chronic migraine 04/06/2014    Past  Surgical History:  Procedure Laterality Date  . ABDOMINAL HYSTERECTOMY    . BACK SURGERY  2010  . CESAREAN SECTION  2002  . CHOLECYSTECTOMY  2010  . COLONOSCOPY    . ECTOPIC PREGNANCY SURGERY  2001, 2004  . OOPHORECTOMY    . TOTAL LAPAROSCOPIC HYSTERECTOMY WITH SALPINGECTOMY  2014  . TUBAL LIGATION  2004    OB History    Gravida  5   Para  3   Term  3   Preterm      AB  2   Living  3     SAB      TAB      Ectopic  2   Multiple      Live Births               Home Medications    Prior to Admission medications   Medication Sig Start Date End Date Taking? Authorizing Provider  albuterol (VENTOLIN HFA) 108 (90 Base) MCG/ACT inhaler Inhale into the lungs. 08/02/15 12/27/17 Yes [provider]  estradiol (ESTRACE) 1 MG tablet Take 1 tablet (1 mg total) by mouth daily. 09/04/17  Yes Gae Dry, MD  fluticasone (FLONASE) 50 MCG/ACT nasal spray USE 1 SPRAY IN EACH NOSTRIL EVERY DAY 05/30/15  Yes [provider]  oxybutynin (DITROPAN-XL) 10 MG 24 hr tablet Take 1 tablet (10 mg total) daily by mouth. 02/03/17  Yes MacDiarmid, Nicki Reaper, MD  promethazine (PHENERGAN) 25 MG tablet Reported on 08/18/2015 08/02/15  Yes [provider]  SUMAtriptan (IMITREX) 50 MG tablet Reported on 08/18/2015 08/01/14  Yes [provider]  topiramate (TOPAMAX) 50 MG tablet TAKE 1 TABLET (50 MG TOTAL) BY MOUTH ONCE DAILY. 05/20/15  Yes [provider]  chlorpheniramine-HYDROcodone (TUSSIONEX PENNKINETIC ER) 10-8 MG/5ML SUER Take 5 mLs by mouth every 12 (twelve) hours as needed. 12/27/17   Norval Gable, MD  Cholecalciferol (VITAMIN D3) 2000 units capsule Take by mouth.    [provider]    Family History Family History  Problem Relation Age of Onset  . Breast cancer Other   . Kidney failure Mother   . Ovarian cancer Mother 70       gene neg  . Breast cancer Paternal Aunt        pat great aunt  . Bladder Cancer Neg Hx   . Kidney cancer Neg  Hx   . Prostate cancer Neg Hx     Social History Social History   Tobacco Use  . Smoking status: Former Research scientist (life sciences)  . Smokeless tobacco: Never Used  Substance Use Topics  . Alcohol use: Yes    Alcohol/week: 0.0 standard drinks    Comment: occasionally  . Drug use: No     Allergies   Latex; Other; and Shellfish allergy   Review of Systems Review of Systems  HENT: Positive for congestion and rhinorrhea.   Respiratory: Positive for cough. Negative for wheezing.      Physical Exam Triage Vital Signs ED Triage Vitals  Enc Vitals Group     BP 12/27/17 0856 115/73     Pulse Rate 12/27/17 0856 70     Resp 12/27/17 0856 16     Temp 12/27/17 0856 98.9 F (37.2 C)     Temp Source 12/27/17 0856 Oral     SpO2 12/27/17 0856 100 %     Weight 12/27/17 0900 198 lb (89.8 kg)     Height 12/27/17 0900 '5\' 3"'$  (1.6 m)     Head Circumference --      Peak Flow --      Pain Score 12/27/17 0900 8     Pain Loc --      Pain Edu? --      Excl. in [place]? --    No data found.  Updated Vital Signs BP 115/73 (BP Location: Left Arm)   Pulse 70   Temp 98.9 F (37.2 C) (Oral)   Resp 16   Ht '5\' 3"'$  (1.6 m)   Wt 89.8 kg   LMP 07/10/2012   SpO2 100%   BMI 35.07 kg/m   Visual Acuity Right Eye Distance:   Left Eye Distance:   Bilateral Distance:    Right Eye Near:   Left Eye Near:    Bilateral Near:     Physical Exam  Constitutional: She appears well-developed and well-nourished. No distress.  HENT:  Head: Normocephalic and atraumatic.  Right Ear: Tympanic membrane, external ear and ear canal normal.  Left Ear: Tympanic membrane, external ear and ear canal normal.  Nose: Mucosal edema and rhinorrhea present. No nose lacerations, sinus tenderness, nasal deformity, septal deviation or nasal septal hematoma. No epistaxis.  No foreign bodies.  Mouth/Throat: Uvula is midline and mucous membranes are normal. Posterior oropharyngeal erythema present. No oropharyngeal exudate or posterior  oropharyngeal edema. No tonsillar exudate.  Neck: Normal range of motion. Neck supple. No thyromegaly present.  Cardiovascular: Normal rate, regular rhythm and normal heart sounds.  Pulmonary/Chest: Effort normal and breath sounds normal. No stridor. No  respiratory distress. She has no wheezes. She has no rales. She exhibits no tenderness.  Musculoskeletal:       Cervical back: She exhibits no tenderness.       Thoracic back: She exhibits tenderness (over the right trapezius and thoracic paraspinous muscles). She exhibits no bony tenderness and no swelling.  Lymphadenopathy:    She has no cervical adenopathy.  Skin: She is not diaphoretic.  Nursing note and vitals reviewed.    UC Treatments / Results  Labs (all labs ordered are listed, but only abnormal results are displayed) Labs Reviewed - No data to display  EKG None  Radiology Dg Chest 2 View  Result Date: 12/27/2017 CLINICAL DATA:  52 year old female with cough for 3 days. Right scapula pain. Former smoker. EXAM: CHEST - 2 VIEW COMPARISON:  04/18/2012 chest radiographs and earlier. FINDINGS: Lung volumes remain normal. Stable cardiac size at the upper limits of normal. Other mediastinal contours are within normal limits. Visualized tracheal air column is within normal limits. Lung parenchyma is stable and clear aside from mild chronic increased interstitium. No pneumothorax or pleural effusion. Stable cholecystectomy clips. Negative visible bowel gas pattern. No acute osseous abnormality identified. IMPRESSION: No acute cardiopulmonary abnormality. Electronically Signed   By: Genevie Ann M.D.   On: 12/27/2017 09:44    Procedures Procedures (including critical care time)  Medications Ordered in UC Medications - No data to display  Initial Impression / Assessment and Plan / UC Course  I have reviewed the triage vital signs and the nursing notes.  Pertinent labs & imaging results that were available during my care of the patient  were reviewed by me and considered in my medical decision making (see chart for details).      Final Clinical Impressions(s) / UC Diagnoses   Final diagnoses:  Viral URI with cough  Costochondritis   Discharge Instructions   None    ED Prescriptions    Medication Sig Dispense Auth. Provider   chlorpheniramine-HYDROcodone (TUSSIONEX PENNKINETIC ER) 10-8 MG/5ML SUER Take 5 mLs by mouth every 12 (twelve) hours as needed. 60 mL Norval Gable, MD      1. x-ray result (negative) and diagnosis reviewed with patient 2. rx as per orders above; reviewed possible side effects, interactions, risks and benefits  3. Recommend supportive treatment with rest, fluids, otc analgesics prn  4. Follow-up prn if symptoms worsen or don't improve   Controlled Substance Prescriptions [place] Controlled Substance Registry consulted? Not Applicable   Norval Gable, MD 12/27/17 [place], [place], MD 12/27/17 1039

## 2018-01-14 ENCOUNTER — Telehealth: Payer: Self-pay | Admitting: Urology

## 2018-01-14 NOTE — Telephone Encounter (Signed)
We had to reschd her app due to Macdiarmid being out of town and she will be out of town, she will need 1 month refill until her app on 02-23-18 called into her pharmacy @ [facility] in [place]. It will expire around 02-03-18.   Thanks, Sharyn Lull

## 2018-01-15 NOTE — Telephone Encounter (Signed)
Spoke to patient and she will call closer to when she will need a refill to get the RX sent to pharmacy.

## 2018-02-02 ENCOUNTER — Ambulatory Visit: Payer: PRIVATE HEALTH INSURANCE | Admitting: Urology

## 2018-02-03 ENCOUNTER — Telehealth: Payer: Self-pay | Admitting: Urology

## 2018-02-03 DIAGNOSIS — N3946 Mixed incontinence: Secondary | ICD-10-CM

## 2018-02-03 DIAGNOSIS — M25572 Pain in left ankle and joints of left foot: Secondary | ICD-10-CM | POA: Insufficient documentation

## 2018-02-03 MED ORDER — OXYBUTYNIN CHLORIDE ER 10 MG PO TB24: 10.0000 mg | ORAL_TABLET | Freq: Every day | ORAL | 1 refills | Status: DC

## 2018-02-03 NOTE — Addendum Note (Signed)
Addended by: Kyra Manges on: 02/03/2018 04:55 PM   Modules accepted: Orders

## 2018-02-03 NOTE — Telephone Encounter (Signed)
Tried calling patient and unable to reach. RX sent to pharmacy [facility] in [place]

## 2018-02-03 NOTE — Telephone Encounter (Signed)
Pt lmom asking for a refill on her Oxybutynin since her appt had to be rescheduled til December, pt states she is out of meds and would like enough to get her to her next appt. Pt did not leave any pharm or Rx details. Please advise. Thanks

## 2018-02-23 ENCOUNTER — Encounter: Payer: Self-pay | Admitting: Urology

## 2018-02-23 ENCOUNTER — Ambulatory Visit: Payer: PRIVATE HEALTH INSURANCE | Admitting: Urology

## 2018-02-23 DIAGNOSIS — N3946 Mixed incontinence: Secondary | ICD-10-CM

## 2018-02-23 MED ORDER — OXYBUTYNIN CHLORIDE ER 10 MG PO TB24: 10.0000 mg | ORAL_TABLET | Freq: Every day | ORAL | 11 refills | Status: DC

## 2018-02-23 NOTE — Progress Notes (Signed)
02/23/2018 10:47 AM   Glendale Chard 1965-12-10 [id number]  Referring provider: Hortencia Pilar, MD [address] [place], [place] [address]  Chief Complaint  Patient presents with  . 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 She leaks with coughing and sneezing and sometimes bending and lifting. There is no question she can leak without awareness. She also has urgency incontinence and denies enuresis and is failed Kegel exercises and Miraberon. She really started Vesicare in its helping some. She wears 3-4 pads a day sometimes damp sometimes moderately wet  She gets up 2-3 times a night and voids every 2-3 hours during the daytime. She's had lower back surgery. Her flow was reasonable  The last time the patient was here she had a grade 2 hypermobility of the bladder neck and a positive cough test. She had a grade 1 rectocele.   On urodynamics the patient had a reduced bladder capacity with bladder overactivity and a mild bladder outlet abnormality  Felt that she had a moderate overactive bladder with a mild to moderate outlet abnormality. Felt both were significant. I gave her oxybutynin last time.We then discussed the sling in detail with my usual template.  01/2016  The patient is almost dry. Sometimes she leaks without awareness. If she gets a bad cold she will have stress incontinence. She truly thinks he oxybutynin is helped a lot with less urgency and frequency. She will have good days and bad days. The prescription was renewed areas I will see her in 1 year  2018 The patient is much better.  She only uses a light pad.  If she is sick she still has significant stress incontinence but does not want surgery.  Today Frequency is stable.  Continues to be very happy with the oxybutynin once a day.  Still has residual stress incontinence and does not wish to have surgery.  She is jogging a  lot now and very happy.   PMH: Past Medical History:  Diagnosis Date  . Anxiety and depression   . BRCA negative 10/2017   MyRisk neg except CDKN2A VUS; IBIS=9.3%  . Family history of ovarian cancer    MyRisk neg  . GERD (gastroesophageal reflux disease)   . H/O nutritional disorder 08/01/2014  . Headache, migraine 04/06/2014  . Hemorrhoids   . IBS (irritable bowel syndrome)   . Leiomyoma of uterus   . Lumbar radiculopathy 06/17/2014   Overview:  S/p bilateral lumbar decompression L4-5 and L5-S1 s/p excision of large central/left disc herniation 09/16/08 with resolution of symptoms recurrent in 2016 with dx of L5-S1 radiculopathy per Dr. Elmer Sow  [place], [facility]. Report scanned.   . Menorrhagia   . Stomach ulcer     Surgical History: Past Surgical History:  Procedure Laterality Date  . ABDOMINAL HYSTERECTOMY    . BACK SURGERY  2010  . CESAREAN SECTION  2002  . CHOLECYSTECTOMY  2010  . COLONOSCOPY    . ECTOPIC PREGNANCY SURGERY  2001, 2004  . OOPHORECTOMY    . TOTAL LAPAROSCOPIC HYSTERECTOMY WITH SALPINGECTOMY  2014  . TUBAL LIGATION  2004    Home Medications:  Allergies as of 02/23/2018      Reactions   Latex Rash   Other Anaphylaxis   Uncoded Allergy. Allergen: DARVON Uncoded Allergy. Allergen: darvocet   Shellfish Allergy Shortness Of Breath      Medication List  Accurate as of 02/23/18 10:47 AM. Always use your most recent med list.          chlorpheniramine-HYDROcodone 10-8 MG/5ML Suer Commonly known as:  TUSSIONEX Take 5 mLs by mouth every 12 (twelve) hours as needed.   estradiol 1 MG tablet Commonly known as:  ESTRACE Take 1 tablet (1 mg total) by mouth daily.   fluticasone 50 MCG/ACT nasal spray Commonly known as:  FLONASE USE 1 SPRAY IN EACH NOSTRIL EVERY DAY   IMITREX 50 MG tablet Generic drug:  SUMAtriptan Reported on 08/18/2015   oxybutynin 10 MG 24 hr tablet Commonly known as:  DITROPAN-XL Take 1 tablet (10 mg total) by mouth daily.     promethazine 25 MG tablet Commonly known as:  PHENERGAN Reported on 08/18/2015   topiramate 50 MG tablet Commonly known as:  TOPAMAX TAKE 1 TABLET (50 MG TOTAL) BY MOUTH ONCE DAILY.   VENTOLIN HFA 108 (90 Base) MCG/ACT inhaler Generic drug:  albuterol Inhale into the lungs.   Vitamin D3 50 MCG (2000 UT) capsule Take by mouth.       Allergies:  Allergies  Allergen Reactions  . Latex Rash  . Other Anaphylaxis    Uncoded Allergy. Allergen: DARVON Uncoded Allergy. Allergen: darvocet  . Shellfish Allergy Shortness Of Breath    Family History: Family History  Problem Relation Age of Onset  . Breast cancer Other   . Kidney failure Mother   . Ovarian cancer Mother 94       gene neg  . Breast cancer Paternal Aunt        pat great aunt  . Bladder Cancer Neg Hx   . Kidney cancer Neg Hx   . Prostate cancer Neg Hx     Social History:  reports that she has quit smoking. She has never used smokeless tobacco. She reports that she drinks alcohol. She reports that she does not use drugs.  ROS: UROLOGY Frequent Urination?: No Hard to postpone urination?: No Burning/pain with urination?: No Get up at night to urinate?: Yes Leakage of urine?: No Urine stream starts and stops?: No Trouble starting stream?: No Do you have to strain to urinate?: No Blood in urine?: No Urinary tract infection?: No Sexually transmitted disease?: No Injury to kidneys or bladder?: No Painful intercourse?: No Weak stream?: No Currently pregnant?: No Vaginal bleeding?: No Last menstrual period?: n  Gastrointestinal Nausea?: No Vomiting?: No Indigestion/heartburn?: Yes Diarrhea?: No Constipation?: No  Constitutional Fever: No Night sweats?: No Weight loss?: No Fatigue?: No  Skin Skin rash/lesions?: No Itching?: No  Eyes Blurred vision?: No Double vision?: No  Ears/Nose/Throat Sore throat?: No Sinus problems?: No  Hematologic/Lymphatic Swollen glands?: No Easy bruising?:  No  Cardiovascular Leg swelling?: No Chest pain?: No  Respiratory Cough?: No Shortness of breath?: No  Endocrine Excessive thirst?: No  Musculoskeletal Back pain?: No Joint pain?: Yes  Neurological Headaches?: No Dizziness?: No  Psychologic Depression?: No Anxiety?: No  Physical Exam: BP 101/67   Pulse 77   Ht '5\' 2"'$  (1.575 m)   Wt 98.9 kg   LMP 07/10/2012   BMI 39.87 kg/m     Laboratory Data: Lab Results  Component Value Date   WBC 8.7 01/25/2013   HGB 9.4 (L) 02/10/2013   HCT 37.7 01/25/2013   MCV 92 01/25/2013   PLT 332 01/25/2013    Lab Results  Component Value Date   CREATININE 0.73 01/25/2013    No results found for: PSA  No results found for: TESTOSTERONE  Lab Results  Component Value Date   HGBA1C 5.1 09/04/2017    Urinalysis    Component Value Date/Time   APPEARANCEUR Clear 06/19/2015 1125   GLUCOSEU Negative 06/19/2015 1125   BILIRUBINUR Negative 06/19/2015 1125   PROTEINUR Negative 06/19/2015 1125   NITRITE Negative 06/19/2015 1125   LEUKOCYTESUR Negative 06/19/2015 1125    Pertinent Imaging:   Assessment & Plan: Prescription renewed and I will see in 1 year.  She gets monthly prescriptions  There are no diagnoses linked to this encounter.  No follow-ups on file.  Reece Packer, MD  [facility] [address] [place], [place] [address] [phone]

## 2018-03-02 DIAGNOSIS — M7670 Peroneal tendinitis, unspecified leg: Secondary | ICD-10-CM | POA: Insufficient documentation

## 2018-03-03 ENCOUNTER — Other Ambulatory Visit: Payer: Self-pay

## 2018-03-24 ENCOUNTER — Encounter: Payer: Self-pay | Admitting: Podiatry

## 2018-03-24 ENCOUNTER — Ambulatory Visit: Payer: PRIVATE HEALTH INSURANCE | Admitting: Podiatry

## 2018-03-24 ENCOUNTER — Ambulatory Visit: Payer: PRIVATE HEALTH INSURANCE

## 2018-03-24 DIAGNOSIS — M659 Synovitis and tenosynovitis, unspecified: Secondary | ICD-10-CM | POA: Diagnosis not present

## 2018-03-24 DIAGNOSIS — M7752 Other enthesopathy of left foot: Secondary | ICD-10-CM

## 2018-03-24 DIAGNOSIS — M7672 Peroneal tendinitis, left leg: Secondary | ICD-10-CM

## 2018-03-24 MED ORDER — MELOXICAM 15 MG PO TABS: 15.0000 mg | ORAL_TABLET | Freq: Every day | ORAL | 1 refills | Status: AC

## 2018-03-24 MED ORDER — METHYLPREDNISOLONE 4 MG PO TBPK: ORAL_TABLET | ORAL | 0 refills | Status: DC

## 2018-03-27 NOTE — Progress Notes (Signed)
   Subjective:  53 year old female presenting today as a new patient with a chief complaint of stabbing lateral left ankle pain that began 2-3 months ago. She reports associated constant aching and pain that radiates up the leg at night. Walking increases the pain. She was seen at [facility] and was diagnosed with tendinitis. She has been using a CAM boot, taking Ibuprofen 800 mg and doing exercises at home. Patient is here for further evaluation and treatment.  Past Medical History:  Diagnosis Date  . Anxiety and depression   . BRCA negative 10/2017   MyRisk neg except CDKN2A VUS; IBIS=9.3%  . Family history of ovarian cancer    MyRisk neg  . GERD (gastroesophageal reflux disease)   . H/O nutritional disorder 08/01/2014  . Headache, migraine 04/06/2014  . Hemorrhoids   . IBS (irritable bowel syndrome)   . Leiomyoma of uterus   . Lumbar radiculopathy 06/17/2014   Overview:  S/p bilateral lumbar decompression L4-5 and L5-S1 s/p excision of large central/left disc herniation 09/16/08 with resolution of symptoms recurrent in 2016 with dx of L5-S1 radiculopathy per Dr. Elmer Sow  Der[place], [facility]. Report scanned.   . Menorrhagia   . Stomach ulcer     Objective / Physical Exam:  General:  The patient is alert and oriented x3 in no acute distress. Dermatology:  Skin is warm, dry and supple bilateral lower extremities. Negative for open lesions or macerations. Vascular:  Palpable pedal pulses bilaterally. No edema or erythema noted. Capillary refill within normal limits. Neurological:  Epicritic and protective threshold grossly intact bilaterally.  Musculoskeletal Exam:  Pain on palpation to the anterior lateral medial aspects of the patient's left ankle. Mild edema noted. Pain with palpation to the peroneal tendon of the left foot. Range of motion within normal limits to all pedal and ankle joints bilateral. Muscle strength 5/5 in all groups bilateral.   Assessment: 1. Ankle synovitis left 2.  Peroneal tendinitis left  Plan of Care:  1. Patient was evaluated. X-Rays from disc reviewed.  2. injection of 0.5 mL Celestone Soluspan injected in the patient's left ankle. 3. ankle brace dispensed 4. Prescription for Medrol Dose Pak provided to patient. 5. Prescription for Meloxicam provided to patient. 6. patient is to return to clinic in 4 weeks   Edrick Kins, DPM [facility]  Dr. Edrick Kins, [address]                                        [place], [place] [address]                Office [phone]  Fax [phone]

## 2018-04-14 ENCOUNTER — Encounter: Payer: Self-pay | Admitting: Podiatry

## 2018-04-14 ENCOUNTER — Ambulatory Visit: Payer: PRIVATE HEALTH INSURANCE | Admitting: Podiatry

## 2018-04-14 DIAGNOSIS — M7672 Peroneal tendinitis, left leg: Secondary | ICD-10-CM | POA: Diagnosis not present

## 2018-04-14 DIAGNOSIS — M659 Synovitis and tenosynovitis, unspecified: Secondary | ICD-10-CM | POA: Diagnosis not present

## 2018-04-17 ENCOUNTER — Ambulatory Visit: Payer: PRIVATE HEALTH INSURANCE | Admitting: Podiatry

## 2018-04-21 NOTE — Progress Notes (Signed)
   Subjective:  53 year old female presenting today for follow up evaluation of left foot and ankle pain. She reports continued pain but states she does have some relief occasionally. She states doing normal day to day activities increases the pain. She has been taking Meloxicam and using the ankle brace for treatment. Patient is here for further evaluation and treatment.  Past Medical History:  Diagnosis Date  . Anxiety and depression   . BRCA negative 10/2017   MyRisk neg except CDKN2A VUS; IBIS=9.3%  . Family history of ovarian cancer    MyRisk neg  . GERD (gastroesophageal reflux disease)   . H/O nutritional disorder 08/01/2014  . Headache, migraine 04/06/2014  . Hemorrhoids   . IBS (irritable bowel syndrome)   . Leiomyoma of uterus   . Lumbar radiculopathy 06/17/2014   Overview:  S/p bilateral lumbar decompression L4-5 and L5-S1 s/p excision of large central/left disc herniation 09/16/08 with resolution of symptoms recurrent in 2016 with dx of L5-S1 radiculopathy per Dr. Elmer Sow  Derien, [facility]. Report scanned.   . Menorrhagia   . Stomach ulcer     Objective / Physical Exam:  General:  The patient is alert and oriented x3 in no acute distress. Dermatology:  Skin is warm, dry and supple bilateral lower extremities. Negative for open lesions or macerations. Vascular:  Palpable pedal pulses bilaterally. No edema or erythema noted. Capillary refill within normal limits. Neurological:  Epicritic and protective threshold grossly intact bilaterally.  Musculoskeletal Exam:  Pain with palpation to the peroneal tendon of the left foot. Range of motion within normal limits to all pedal and ankle joints bilateral. Muscle strength 5/5 in all groups bilateral.   Assessment: 1. Ankle synovitis left - resolved  2. Peroneal tendinitis left  Plan of Care:  1. Patient was evaluated.   2. injection of 0.5 mL Celestone Soluspan injected into the peroneal tendon sheath of the left foot. 3.  Continue taking Meloxicam as needed.  4. Continue using ankle brace as needed.  5. Return to clinic in 6 weeks.    Edrick Kins, DPM [facility]  Dr. Edrick Kins, [address]                                        [place],  [address]                Office [phone]  Fax [phone]

## 2018-05-26 ENCOUNTER — Encounter: Payer: Self-pay | Admitting: Podiatry

## 2018-05-26 ENCOUNTER — Ambulatory Visit: Payer: PRIVATE HEALTH INSURANCE | Admitting: Podiatry

## 2018-05-26 DIAGNOSIS — M7672 Peroneal tendinitis, left leg: Secondary | ICD-10-CM | POA: Diagnosis not present

## 2018-05-26 DIAGNOSIS — Q667 Congenital pes cavus, unspecified foot: Secondary | ICD-10-CM | POA: Diagnosis not present

## 2018-05-26 DIAGNOSIS — M659 Synovitis and tenosynovitis, unspecified: Secondary | ICD-10-CM | POA: Diagnosis not present

## 2018-05-27 NOTE — Progress Notes (Signed)
   Subjective:  53 year old female presenting today for follow up evaluation of left foot and ankle pain. She states her ankle pain has improved significantly. She reports some continued pain in the arches. She states her husband accidentally stepped on her left foot last night and it is now sore on the dorsal aspect. She states she is able to walk without issue. Patient is here for further evaluation and treatment.  Past Medical History:  Diagnosis Date  . Anxiety and depression   . BRCA negative 10/2017   MyRisk neg except CDKN2A VUS; IBIS=9.3%  . Family history of ovarian cancer    MyRisk neg  . GERD (gastroesophageal reflux disease)   . H/O nutritional disorder 08/01/2014  . Headache, migraine 04/06/2014  . Hemorrhoids   . IBS (irritable bowel syndrome)   . Leiomyoma of uterus   . Lumbar radiculopathy 06/17/2014   Overview:  S/p bilateral lumbar decompression L4-5 and L5-S1 s/p excision of large central/left disc herniation 09/16/08 with resolution of symptoms recurrent in 2016 with dx of L5-S1 radiculopathy per Dr. Elmer Sow  Derien, [facility]. Report scanned.   . Menorrhagia   . Stomach ulcer     Objective / Physical Exam:  General:  The patient is alert and oriented x3 in no acute distress. Dermatology:  Skin is warm, dry and supple bilateral lower extremities. Negative for open lesions or macerations. Vascular:  Palpable pedal pulses bilaterally. No edema or erythema noted. Capillary refill within normal limits. Neurological:  Epicritic and protective threshold grossly intact bilaterally.  Musculoskeletal Exam:  High arches/cavus feet noted bilaterally. Range of motion within normal limits to all pedal and ankle joints bilateral. Muscle strength 5/5 in all groups bilateral.   Assessment: 1. Ankle synovitis left - resolved  2. Peroneal tendinitis left - resolved  3. High arches/cavus feet bilateral  Plan of Care:  1. Patient was evaluated.   2. Recommended good shoe gear.  3.  Plantar fascial brace dispensed.  4. Continue taking Meloxicam as needed.  5. Return to clinic as needed.     Edrick Kins, DPM [facility]  Dr. Edrick Kins, [address]                                        [place], [place] [address]                Office [phone]  Fax [phone]

## 2018-09-08 ENCOUNTER — Other Ambulatory Visit: Payer: Self-pay | Admitting: Obstetrics & Gynecology

## 2018-09-08 DIAGNOSIS — Z0000 Encounter for general adult medical examination without abnormal findings: Secondary | ICD-10-CM

## 2018-09-10 ENCOUNTER — Encounter: Payer: Self-pay | Admitting: Obstetrics & Gynecology

## 2018-09-10 ENCOUNTER — Ambulatory Visit (INDEPENDENT_AMBULATORY_CARE_PROVIDER_SITE_OTHER): Payer: PRIVATE HEALTH INSURANCE | Admitting: Obstetrics & Gynecology

## 2018-09-10 ENCOUNTER — Other Ambulatory Visit: Payer: Self-pay

## 2018-09-10 VITALS — BP 120/80 | Ht 63.0 in | Wt 218.0 lb

## 2018-09-10 DIAGNOSIS — Z1239 Encounter for other screening for malignant neoplasm of breast: Secondary | ICD-10-CM

## 2018-09-10 DIAGNOSIS — Z01419 Encounter for gynecological examination (general) (routine) without abnormal findings: Secondary | ICD-10-CM | POA: Diagnosis not present

## 2018-09-10 DIAGNOSIS — Z1211 Encounter for screening for malignant neoplasm of colon: Secondary | ICD-10-CM

## 2018-09-10 DIAGNOSIS — Z780 Asymptomatic menopausal state: Secondary | ICD-10-CM

## 2018-09-10 DIAGNOSIS — Z1321 Encounter for screening for nutritional disorder: Secondary | ICD-10-CM

## 2018-09-10 DIAGNOSIS — Z0000 Encounter for general adult medical examination without abnormal findings: Secondary | ICD-10-CM

## 2018-09-10 MED ORDER — ESTRADIOL 1 MG PO TABS: ORAL_TABLET | ORAL | 12 refills | Status: DC

## 2018-09-10 NOTE — Progress Notes (Signed)
HPI:      [name] is a 53 y.o. [id number] who LMP was in the past, she presents today for her annual examination.  The patient has no complaints today. The patient is sexually active. Herlast pap: approximate date 2019 and was normal and last mammogram: approximate date 2019 and was normal.  The patient does perform self breast exams.  There is notable family history of breast or ovarian cancer in her family.  MyRisk Neg, T-C 11%.  Mother died w Ovarian Cancer. She has had prior TLH BSO. The patient is taking hormone replacement therapy. Patient denies post-menopausal vaginal bleeding.   The patient has regular exercise: yes. The patient reports some current symptoms of depression and anxiety related to the Coronoa virus.    GYN Hx: Last Colonoscopy:2 years ago. Normal.  Last DEXA: never ago.    PMHx: Past Medical History:  Diagnosis Date  . Anxiety and depression   . BRCA negative 10/2017   MyRisk neg except CDKN2A VUS; IBIS=9.3%  . Family history of ovarian cancer    MyRisk neg  . GERD (gastroesophageal reflux disease)   . H/O nutritional disorder 08/01/2014  . Headache, migraine 04/06/2014  . Hemorrhoids   . IBS (irritable bowel syndrome)   . Leiomyoma of uterus   . Lumbar radiculopathy 06/17/2014   Overview:  S/p bilateral lumbar decompression L4-5 and L5-S1 s/p excision of large central/left disc herniation 09/16/08 with resolution of symptoms recurrent in 2016 with dx of L5-S1 radiculopathy per Dr. Elmer Munoz  Katrina Munoz, [facility]. Report scanned.   . Menorrhagia   . Stomach ulcer    Past Surgical History:  Procedure Laterality Date  . ABDOMINAL HYSTERECTOMY    . BACK SURGERY  2010  . CESAREAN SECTION  2002  . CHOLECYSTECTOMY  2010  . COLONOSCOPY    . ECTOPIC PREGNANCY SURGERY  2001, 2004  . OOPHORECTOMY    . TOTAL LAPAROSCOPIC HYSTERECTOMY WITH SALPINGECTOMY  2014  . TUBAL LIGATION  2004   Family History  Problem Relation Age of Onset  . Breast cancer Other   . Kidney  failure Mother   . Ovarian cancer Mother 75       gene neg  . Breast cancer Paternal Aunt        pat great aunt  . Bladder Cancer Neg Hx   . Kidney cancer Neg Hx   . Prostate cancer Neg Hx    Social History   Tobacco Use  . Smoking status: Former Research scientist (life sciences)  . Smokeless tobacco: Never Used  Substance Use Topics  . Alcohol use: Yes    Alcohol/week: 0.0 standard drinks    Comment: occasionally  . Drug use: No    Current Outpatient Medications:  .  Cholecalciferol (VITAMIN D3) 2000 units capsule, Take by mouth., Disp: , Rfl:  .  estradiol (ESTRACE) 1 MG tablet, TAKE 1 TABLET(1 MG) BY MOUTH DAILY, Disp: 30 tablet, Rfl: 12 .  fluticasone (FLONASE) 50 MCG/ACT nasal spray, USE 1 SPRAY IN EACH NOSTRIL EVERY DAY, Disp: , Rfl: 12 .  oxybutynin (DITROPAN-XL) 10 MG 24 hr tablet, Take 1 tablet (10 mg total) by mouth daily., Disp: 30 tablet, Rfl: 11 .  SUMAtriptan (IMITREX) 50 MG tablet, Reported on 08/18/2015, Disp: , Rfl:  .  topiramate (TOPAMAX) 50 MG tablet, TAKE 1 TABLET (50 MG TOTAL) BY MOUTH ONCE DAILY., Disp: , Rfl: 4 .  albuterol (VENTOLIN HFA) 108 (90 Base) MCG/ACT inhaler, Inhale into the lungs., Disp: , Rfl:  .  methylPREDNISolone (MEDROL DOSEPAK) 4 MG TBPK tablet, 6 day dose pack - take as directed, Disp: 21 tablet, Rfl: 0 .  promethazine (PHENERGAN) 25 MG tablet, Reported on 08/18/2015, Disp: , Rfl: 0 Allergies: Latex, Other, and Shellfish allergy  Review of Systems  Constitutional: Positive for malaise/fatigue. Negative for chills and fever.  HENT: Negative for congestion, sinus pain and sore throat.   Eyes: Negative for blurred vision and pain.  Respiratory: Negative for cough and wheezing.   Cardiovascular: Negative for chest pain and leg swelling.  Gastrointestinal: Negative for abdominal pain, constipation, diarrhea, heartburn, nausea and vomiting.  Genitourinary: Negative for dysuria, frequency, hematuria and urgency.  Musculoskeletal: Negative for back pain, joint pain,  myalgias and neck pain.  Skin: Negative for itching and rash.  Neurological: Negative for dizziness, tremors and weakness.  Endo/Heme/Allergies: Does not bruise/bleed easily.  Psychiatric/Behavioral: Positive for depression. The patient is nervous/anxious. The patient does not have insomnia.     Objective: BP 120/80   Ht 5' 3" (1.6 m)   Wt 218 lb (98.9 kg)   LMP 07/10/2012   BMI 38.62 kg/m   Filed Weights   09/10/18 0910  Weight: 218 lb (98.9 kg)   Body mass index is 38.62 kg/m. Physical Exam Constitutional:      General: She is not in acute distress.    Appearance: She is well-developed.  Genitourinary:     Pelvic exam was performed with patient supine.     Vagina and rectum normal.     No lesions in the vagina.     No vaginal bleeding.     No right or left adnexal mass present.     Right adnexa not tender.     Left adnexa not tender.     Genitourinary Comments: Absent Uterus Absent cervix Vaginal cuff well healed  HENT:     Head: Normocephalic and atraumatic. No laceration.     Right Ear: Hearing normal.     Left Ear: Hearing normal.     Mouth/Throat:     Pharynx: Uvula midline.  Eyes:     Pupils: Pupils are equal, round, and reactive to light.  Neck:     Musculoskeletal: Normal range of motion and neck supple.     Thyroid: No thyromegaly.  Cardiovascular:     Rate and Rhythm: Normal rate and regular rhythm.     Heart sounds: No murmur. No friction rub. No gallop.   Pulmonary:     Effort: Pulmonary effort is normal. No respiratory distress.     Breath sounds: Normal breath sounds. No wheezing.  Chest:     Breasts:        Right: No mass, skin change or tenderness.        Left: No mass, skin change or tenderness.  Abdominal:     General: Bowel sounds are normal. There is no distension.     Palpations: Abdomen is soft.     Tenderness: There is no abdominal tenderness. There is no rebound.  Musculoskeletal: Normal range of motion.  Neurological:     Mental  Status: She is alert and oriented to person, place, and time.     Cranial Nerves: No cranial nerve deficit.  Skin:    General: Skin is warm and dry.  Psychiatric:        Judgment: Judgment normal.  Vitals signs reviewed.     Assessment: Annual Exam 1. Women's annual routine gynecological examination   2. Screening for breast cancer   3. Screen for  colon cancer   4. Encounter for vitamin deficiency screening   5. Menopause     Plan:            1.  Cervical Screening-  Pap smear schedule reviewed with patient  2. Breast screening- Exam annually and mammogram scheduled  3. Colonoscopy every 10 years, Hemoccult testing after age 20  4. Labs Ordered today Vit D level  5. Counseling for hormonal therapy: no change in therapy today  6. Menopause, cont ERT. Pros and cons discussed.  Desires to continue has still has hot flashes when tries to discontinue.              7. FRAX - FRAX score for assessing the 10 year probability for fracture calculated and discussed today.  Based on age and score today, DEXA is not currently scheduled.    F/U  Return in about 1 year (around 09/10/2019) for Annual.  Barnett Applebaum, MD, [facility], [facility] 09/10/2018  9:49 AM

## 2018-09-10 NOTE — Patient Instructions (Signed)
PAP every 5 years Mammogram every year    Call [phone] to schedule at [facility] Colonoscopy every 10 years Labs normal 2019

## 2018-09-11 LAB — VITAMIN D 25 HYDROXY (VIT D DEFICIENCY, FRACTURES): Vit D, 25-Hydroxy: 39.2 ng/mL (ref 30.0–100.0)

## 2018-10-12 ENCOUNTER — Ambulatory Visit
Admission: RE | Admit: 2018-10-12 | Discharge: 2018-10-12 | Disposition: A | Discharge status: Home / Self Care | Payer: PRIVATE HEALTH INSURANCE | Source: Ambulatory Visit | Attending: Obstetrics & Gynecology | Admitting: Obstetrics & Gynecology

## 2018-10-12 ENCOUNTER — Other Ambulatory Visit: Payer: Self-pay

## 2018-10-12 DIAGNOSIS — Z1239 Encounter for other screening for malignant neoplasm of breast: Secondary | ICD-10-CM

## 2018-10-12 DIAGNOSIS — Z1231 Encounter for screening mammogram for malignant neoplasm of breast: Secondary | ICD-10-CM | POA: Insufficient documentation

## 2018-10-14 ENCOUNTER — Other Ambulatory Visit: Payer: Self-pay | Admitting: Podiatry

## 2018-11-02 DIAGNOSIS — L237 Allergic contact dermatitis due to plants, except food: Secondary | ICD-10-CM | POA: Insufficient documentation

## 2018-12-14 ENCOUNTER — Other Ambulatory Visit: Payer: Self-pay | Admitting: Obstetrics & Gynecology

## 2018-12-14 DIAGNOSIS — Z780 Asymptomatic menopausal state: Secondary | ICD-10-CM

## 2019-03-01 ENCOUNTER — Ambulatory Visit: Payer: PRIVATE HEALTH INSURANCE | Admitting: Urology

## 2019-03-08 ENCOUNTER — Encounter: Payer: Self-pay | Admitting: Urology

## 2019-03-08 ENCOUNTER — Ambulatory Visit: Payer: PRIVATE HEALTH INSURANCE | Admitting: Urology

## 2019-03-08 ENCOUNTER — Other Ambulatory Visit: Payer: Self-pay

## 2019-03-08 VITALS — BP 142/78 | HR 84 | Ht 63.0 in | Wt 225.0 lb

## 2019-03-08 DIAGNOSIS — N3946 Mixed incontinence: Secondary | ICD-10-CM

## 2019-03-08 NOTE — Progress Notes (Signed)
03/08/2019 10:00 AM   Katrina Munoz April 14, 1965 [id number]  Referring provider: Hortencia Pilar, MD [address] [place],  [place] [address]  Chief Complaint  Patient presents with  . 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 She leaks with coughing and sneezing and sometimes bending and lifting. There is no question she can leak without awareness. She also has urgency incontinence and denies enuresis and is failed Kegel exercises and Miraberon. She really started Vesicare in its helping some. 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 She had a grade 1 rectocele.   On urodynamics the patient had a reduced bladder capacity with bladder overactivity and a mild bladder outlet abnormality  Felt that she had a moderate overactive bladder with a mild to moderate outlet abnormality. Felt both were significant. I gave her oxybutynin last time.We then discussed the sling in detail with my usual template.  01/2016  The patient is almost dry. Sometimes she leaks without awareness. If she gets a bad cold she will have stress incontinence. She truly thinks he oxybutynin is helped a lot with less urgency and frequency. She will have good days and bad days.   2018 The patient is much better. She only uses a light pad. If she is sick shestill has significant stress incontinence but does not want surgery.   Today Frequency is stable Incontinence worsening over 6 months.  She leaks more with coughing sneezing bending.  She still has some urge incontinence if she holds it too long getting out of her car.  She had one episode of bedwetting.  I think when she is sleeping she sometimes can leak a small amount otherwise.  She is getting up 2 times at night which is worse.  She thinks weight gain has aggravated her  symptoms  2017  On uroflow she voided 319 mL with a maximum flow 26 mils per second and she empty efficiently. Max capacity is 355 mL. Bladder was unstable reach a pressure 6 cm of water and she did not leak. At 200 mL she had mild leakage of 105 cm water. It was as low as 76 cm water at 100 mL. During voluntary voiding she voided 326 mils of maximal a 20 mils per second. Maximum voiding pressure 27 years water. She empty efficiently. EMG activity increased during the voiding phase. The details of the urine after signed dictated urodynamics sheet.    PMH: Past Medical History:  Diagnosis Date  . Anxiety and depression   . BRCA negative 10/2017   MyRisk neg except CDKN2A VUS; IBIS=9.3%  . Family history of ovarian cancer    MyRisk neg  . GERD (gastroesophageal reflux disease)   . H/O nutritional disorder 08/01/2014  . Headache, migraine 04/06/2014  . Hemorrhoids   . IBS (irritable bowel syndrome)   . Leiomyoma of uterus   . Lumbar radiculopathy 06/17/2014   Overview:  S/p bilateral lumbar decompression L4-5 and L5-S1 s/p excision of large central/left disc herniation 09/16/08 with resolution of symptoms recurrent in 2016 with dx of L5-S1 radiculopathy per Dr. Elmer Sow  [place], [facility]. Report scanned.   . Menorrhagia   . Stomach ulcer     Surgical History: Past Surgical History:  Procedure Laterality Date  . ABDOMINAL HYSTERECTOMY    . BACK SURGERY  2010  . CESAREAN  SECTION  2002  . CHOLECYSTECTOMY  2010  . COLONOSCOPY    . ECTOPIC PREGNANCY SURGERY  2001, 2004  . OOPHORECTOMY    . TOTAL LAPAROSCOPIC HYSTERECTOMY WITH SALPINGECTOMY  2014  . TUBAL LIGATION  2004    Home Medications:  Allergies as of 03/08/2019      Reactions   Latex Rash   Other Anaphylaxis   Uncoded Allergy. Allergen: DARVON Uncoded Allergy. Allergen: darvocet   Shellfish Allergy Shortness Of Breath      Medication List       Accurate as of March 08, 2019 10:00 AM. If you have any questions, ask your  nurse or doctor.        STOP taking these medications   Ventolin HFA 108 (90 Base) MCG/ACT inhaler Generic drug: albuterol Stopped by: Reece Packer, MD     TAKE these medications   estradiol 1 MG tablet Commonly known as: ESTRACE TAKE 1 TABLET(1 MG) BY MOUTH DAILY   fluticasone 50 MCG/ACT nasal spray Commonly known as: FLONASE USE 1 SPRAY IN EACH NOSTRIL EVERY DAY   Imitrex 50 MG tablet Generic drug: SUMAtriptan Reported on 08/18/2015   meloxicam 15 MG tablet Commonly known as: MOBIC   methylPREDNISolone 4 MG Tbpk tablet Commonly known as: MEDROL DOSEPAK 6 day dose pack - take as directed   oxybutynin 10 MG 24 hr tablet Commonly known as: DITROPAN-XL Take 1 tablet (10 mg total) by mouth daily.   promethazine 25 MG tablet Commonly known as: PHENERGAN Reported on 08/18/2015   topiramate 50 MG tablet Commonly known as: TOPAMAX TAKE 1 TABLET (50 MG TOTAL) BY MOUTH ONCE DAILY.   Vitamin D3 50 MCG (2000 UT) capsule Take by mouth.       Allergies:  Allergies  Allergen Reactions  . Latex Rash  . Other Anaphylaxis    Uncoded Allergy. Allergen: DARVON Uncoded Allergy. Allergen: darvocet  . Shellfish Allergy Shortness Of Breath    Family History: Family History  Problem Relation Age of Onset  . Breast cancer Other   . Kidney failure Mother   . Ovarian cancer Mother 27       gene neg  . Breast cancer Paternal Aunt        pat great aunt  . Bladder Cancer Neg Hx   . Kidney cancer Neg Hx   . Prostate cancer Neg Hx   . BRCA 1/2 Neg Hx     Social History:  reports that she has quit smoking. She has never used smokeless tobacco. She reports current alcohol use. She reports that she does not use drugs.  ROS: UROLOGY Frequent Urination?: No Hard to postpone urination?: No Burning/pain with urination?: No Get up at night to urinate?: Yes Leakage of urine?: Yes Urine stream starts and stops?: No Trouble starting stream?: No Do you have to strain to  urinate?: No Blood in urine?: No Urinary tract infection?: No Sexually transmitted disease?: No Injury to kidneys or bladder?: No Painful intercourse?: No Weak stream?: No Currently pregnant?: No Vaginal bleeding?: No Last menstrual period?: N  Gastrointestinal Nausea?: No Vomiting?: No Indigestion/heartburn?: No Diarrhea?: No Constipation?: No  Constitutional Fever: No Night sweats?: No Weight loss?: No Fatigue?: No  Skin Skin rash/lesions?: No Itching?: No  Eyes Blurred vision?: No Double vision?: No  Ears/Nose/Throat Sore throat?: No Sinus problems?: No  Hematologic/Lymphatic Swollen glands?: No Easy bruising?: No  Cardiovascular Leg swelling?: No Chest pain?: No  Respiratory Cough?: No Shortness of breath?: No  Endocrine Excessive thirst?:  No  Musculoskeletal Back pain?: No Joint pain?: No  Neurological Headaches?: No Dizziness?: No  Psychologic Depression?: No Anxiety?: No  Physical Exam: BP (!) 142/78   Pulse 84   Ht '5\' 3"'$  (1.6 m)   Wt 225 lb (102.1 kg)   LMP 07/10/2012   BMI 39.86 kg/m   Constitutional:  Alert and oriented, No acute distress.   Laboratory Data: Lab Results  Component Value Date   WBC 8.7 01/25/2013   HGB 9.4 (L) 02/10/2013   HCT 37.7 01/25/2013   MCV 92 01/25/2013   PLT 332 01/25/2013    Lab Results  Component Value Date   CREATININE 0.73 01/25/2013    No results found for: PSA  No results found for: TESTOSTERONE  Lab Results  Component Value Date   HGBA1C 5.1 09/04/2017    Urinalysis    Component Value Date/Time   APPEARANCEUR Clear 06/19/2015 1125   GLUCOSEU Negative 06/19/2015 1125   BILIRUBINUR Negative 06/19/2015 1125   PROTEINUR Negative 06/19/2015 1125   NITRITE Negative 06/19/2015 1125   LEUKOCYTESUR Negative 06/19/2015 1125    Pertinent Imaging:   Assessment & Plan: Patient has worse mixed incontinence.  She was evaluated urodynamically in 2017.  Patient does not want to  think about surgery.  She is hoping to get more active but currently is looking after to parents.  30x11 oxybutynin renewed and see in 1 year  There are no diagnoses linked to this encounter.  No follow-ups on file.  Reece Packer, MD  [place] [address] [place], [place] [address] [phone]

## 2019-03-09 LAB — MICROSCOPIC EXAMINATION
Bacteria, UA: NONE SEEN
RBC: NONE SEEN /hpf (ref 0–2)
WBC, UA: NONE SEEN /hpf (ref 0–5)

## 2019-03-09 LAB — URINALYSIS, COMPLETE
Bilirubin, UA: NEGATIVE
Glucose, UA: NEGATIVE
Ketones, UA: NEGATIVE
Leukocytes,UA: NEGATIVE
Nitrite, UA: NEGATIVE
Protein,UA: NEGATIVE
RBC, UA: NEGATIVE
Specific Gravity, UA: >1.03 — ABNORMAL HIGH (ref 1.005–1.030)
Urobilinogen, Ur: 0.2 mg/dL (ref 0.2–1.0)
pH, UA: 5.5 (ref 5.0–7.5)

## 2019-03-10 LAB — CULTURE, URINE COMPREHENSIVE

## 2019-05-23 ENCOUNTER — Ambulatory Visit: Payer: PRIVATE HEALTH INSURANCE | Attending: Internal Medicine

## 2019-05-23 DIAGNOSIS — Z23 Encounter for immunization: Secondary | ICD-10-CM | POA: Insufficient documentation

## 2019-05-23 NOTE — Progress Notes (Signed)
   Covid-19 Vaccination Clinic  Name:  Katrina Munoz    MRN: [id number] DOB: September 19, 1965  05/23/2019  Ms. [name] was observed post Covid-19 immunization for 15 minutes without incidence. She was provided with Vaccine Information Sheet and instruction to access the V-Safe system.   Ms. [name] was instructed to call 911 with any severe reactions post vaccine: Marland Kitchen Difficulty breathing  . Swelling of your face and throat  . A fast heartbeat  . A bad rash all over your body  . Dizziness and weakness    Immunizations Administered    Name Date Dose VIS Date Route   Pfizer COVID-19 Vaccine 05/23/2019  9:37 AM 0.3 mL 03/05/2019 Intramuscular   Manufacturer: [place]   Lot: [id number]   [place]: [id number]

## 2019-06-14 ENCOUNTER — Ambulatory Visit: Payer: PRIVATE HEALTH INSURANCE | Attending: Internal Medicine

## 2019-06-14 DIAGNOSIS — Z23 Encounter for immunization: Secondary | ICD-10-CM

## 2019-06-14 NOTE — Progress Notes (Signed)
   Covid-19 Vaccination Clinic  Name:  Katrina Munoz    MRN: [id number] DOB: 05-Jun-1965  06/14/2019  [name] was observed post Covid-19 immunization for 30 minutes based on pre-vaccination screening without incident. She was provided with Vaccine Information Sheet and instruction to access the V-Safe system.   [name] was instructed to call 911 with any severe reactions post vaccine: Marland Kitchen Difficulty breathing  . Swelling of face and throat  . A fast heartbeat  . A bad rash all over body  . Dizziness and weakness   Immunizations Administered    Name Date Dose VIS Date Route   Pfizer COVID-19 Vaccine 06/14/2019  9:27 AM 0.3 mL 03/05/2019 Intramuscular   Manufacturer: [place]   Lot: [id number]   [place]: [id number]

## 2019-08-09 ENCOUNTER — Ambulatory Visit: Payer: Commercial Managed Care - PPO | Admitting: Urology

## 2019-08-09 ENCOUNTER — Other Ambulatory Visit: Payer: Self-pay

## 2019-08-09 ENCOUNTER — Telehealth: Payer: Self-pay

## 2019-08-09 VITALS — BP 119/87 | HR 96 | Ht 63.0 in | Wt 232.0 lb

## 2019-08-09 DIAGNOSIS — N201 Calculus of ureter: Secondary | ICD-10-CM

## 2019-08-09 DIAGNOSIS — N3000 Acute cystitis without hematuria: Secondary | ICD-10-CM

## 2019-08-09 DIAGNOSIS — R1084 Generalized abdominal pain: Secondary | ICD-10-CM

## 2019-08-09 DIAGNOSIS — R3989 Other symptoms and signs involving the genitourinary system: Secondary | ICD-10-CM

## 2019-08-09 LAB — BLADDER SCAN AMB NON-IMAGING: Scan Result: 31

## 2019-08-09 LAB — URINALYSIS, COMPLETE
Bilirubin, UA: NEGATIVE
Glucose, UA: NEGATIVE
Ketones, UA: NEGATIVE
Leukocytes,UA: NEGATIVE
Nitrite, UA: NEGATIVE
Protein,UA: NEGATIVE
RBC, UA: NEGATIVE
Specific Gravity, UA: 1.025 (ref 1.005–1.030)
Urobilinogen, Ur: 0.2 mg/dL (ref 0.2–1.0)
pH, UA: 7 (ref 5.0–7.5)

## 2019-08-09 LAB — MICROSCOPIC EXAMINATION
Bacteria, UA: NONE SEEN
RBC: NONE SEEN /hpf (ref 0–2)

## 2019-08-09 MED ORDER — SULFAMETHOXAZOLE-TRIMETHOPRIM 800-160 MG PO TABS: 1.0000 | ORAL_TABLET | Freq: Two times a day (BID) | ORAL | 0 refills | Status: DC

## 2019-08-09 NOTE — Telephone Encounter (Signed)
Patient called having severe bladder pain . Advised pt to come in this morning to see dr. Matilde Sprang.

## 2019-08-09 NOTE — Progress Notes (Signed)
08/09/2019 11:30 AM   Glendale Chard Dec 28, 1965 [id number]  Referring provider: Hortencia Pilar, [place],  [place] [address]  No chief complaint on file.   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 She leaks with coughing and sneezing and sometimes bending and lifting. There is no question she can leak without awareness. She also has urgency incontinence and denies enuresis and is failed Kegel exercises and Miraberon. She really started Vesicare in its helping some. She wears 3-4 pads a day sometimes damp sometimes moderately wet  The last time the patient was here she had a grade 2 hypermobility of the bladder neck and a positive cough test. She had a grade 1 rectocele.   On urodynamics the patient had a reduced bladder capacity with bladder overactivity and a mild bladder outlet abnormality  Felt that she had a moderate overactive bladder with a mild to moderate outlet abnormality. Felt both were significant. I gave her oxybutynin last time.We then discussed the sling in detail with my usual template.  The patient is almost dry. Sometimes she leaks without awareness. If she gets a bad cold she will have stress incontinence. She truly thinks he oxybutynin is helped a lot with less urgency and frequency.   Continues to be very happy with the oxybutynin once a day.  Still has residual stress incontinence and does not wish to have surgery.  She is jogging a lot now and very happy.  Today Patient was doing very well on oxybutynin until yesterday.  She drank a soda and thought she was having burping and gas.  She then was having significant discomfort near the pubic bone but not in the vagina.  It steady.  It is worse this morning.  No burning in the vagina.  No cloudiness or foul-smelling urine.  She has to go more frequently or constantly and this is much worse than her baseline.  She has never had a CT  scan  Urinalysis negative.  Urine culture sent   Patient points to low suprapubic area but I could not feel any tenderness.  Pelvic examination was limited but normal.       PMH: Past Medical History:  Diagnosis Date  . Anxiety and depression   . BRCA negative 10/2017   MyRisk neg except CDKN2A VUS; IBIS=9.3%  . Family history of ovarian cancer    MyRisk neg  . GERD (gastroesophageal reflux disease)   . H/O nutritional disorder 08/01/2014  . Headache, migraine 04/06/2014  . Hemorrhoids   . IBS (irritable bowel syndrome)   . Leiomyoma of uterus   . Lumbar radiculopathy 06/17/2014   Overview:  S/p bilateral lumbar decompression L4-5 and L5-S1 s/p excision of large central/left disc herniation 09/16/08 with resolution of symptoms recurrent in 2016 with dx of L5-S1 radiculopathy per Dr. Elmer Sow  [place], [facility]. Report scanned.   . Menorrhagia   . Stomach ulcer     Surgical History: Past Surgical History:  Procedure Laterality Date  . ABDOMINAL HYSTERECTOMY    . BACK SURGERY  2010  . CESAREAN SECTION  2002  . CHOLECYSTECTOMY  2010  . COLONOSCOPY    . ECTOPIC PREGNANCY SURGERY  2001, 2004  . OOPHORECTOMY    . TOTAL LAPAROSCOPIC HYSTERECTOMY WITH SALPINGECTOMY  2014  . TUBAL LIGATION  2004    Home Medications:  Allergies as of 08/09/2019      Reactions   Latex Rash   Other  Anaphylaxis   Uncoded Allergy. Allergen: DARVON Uncoded Allergy. Allergen: darvocet   Shellfish Allergy Shortness Of Breath      Medication List       Accurate as of Aug 09, 2019 11:30 AM. If you have any questions, ask your nurse or doctor.        estradiol 1 MG tablet Commonly known as: ESTRACE TAKE 1 TABLET(1 MG) BY MOUTH DAILY   fluticasone 50 MCG/ACT nasal spray Commonly known as: FLONASE USE 1 SPRAY IN EACH NOSTRIL EVERY DAY   Imitrex 50 MG tablet Generic drug: SUMAtriptan Reported on 08/18/2015   meloxicam 15 MG tablet Commonly known as: MOBIC   methylPREDNISolone 4 MG Tbpk  tablet Commonly known as: MEDROL DOSEPAK 6 day dose pack - take as directed   oxybutynin 10 MG 24 hr tablet Commonly known as: DITROPAN-XL Take 1 tablet (10 mg total) by mouth daily.   promethazine 25 MG tablet Commonly known as: PHENERGAN Reported on 08/18/2015   topiramate 50 MG tablet Commonly known as: TOPAMAX TAKE 1 TABLET (50 MG TOTAL) BY MOUTH ONCE DAILY.   Vitamin D3 50 MCG (2000 UT) capsule Take by mouth.       Allergies:  Allergies  Allergen Reactions  . Latex Rash  . Other Anaphylaxis    Uncoded Allergy. Allergen: DARVON Uncoded Allergy. Allergen: darvocet  . Shellfish Allergy Shortness Of Breath    Family History: Family History  Problem Relation Age of Onset  . Breast cancer Other   . Kidney failure Mother   . Ovarian cancer Mother 99       gene neg  . Breast cancer Paternal Aunt        pat great aunt  . Bladder Cancer Neg Hx   . Kidney cancer Neg Hx   . Prostate cancer Neg Hx   . BRCA 1/2 Neg Hx     Social History:  reports that she has quit smoking. She has never used smokeless tobacco. She reports current alcohol use. She reports that she does not use drugs.  ROS:                                        Physical Exam: LMP 07/10/2012   Constitutional:  Alert and oriented, No acute distress.   Laboratory Data: Lab Results  Component Value Date   WBC 8.7 01/25/2013   HGB 9.4 (L) 02/10/2013   HCT 37.7 01/25/2013   MCV 92 01/25/2013   PLT 332 01/25/2013    Lab Results  Component Value Date   CREATININE 0.73 01/25/2013    No results found for: PSA  No results found for: TESTOSTERONE  Lab Results  Component Value Date   HGBA1C 5.1 09/04/2017    Urinalysis    Component Value Date/Time   APPEARANCEUR Clear 03/08/2019 1033   GLUCOSEU Negative 03/08/2019 1033   BILIRUBINUR Negative 03/08/2019 1033   PROTEINUR Negative 03/08/2019 1033   NITRITE Negative 03/08/2019 1033   LEUKOCYTESUR Negative 03/08/2019  1033    Pertinent Imaging:   Assessment & Plan: I think the patient has a bladder infection though the urine looked normal.  I started her on Septra DS 1 tablet twice a day for a week.  Call if urine culture differs.  See nurse practitioner in the next week to make certain pain went away.  Clinically she is presenting as a bladder infection with urgency and frequency  and pain  Because of the acute onset even though the pain was in the midline I decide to get a CT stone protocol tomorrow and I will call her if she had a stone.  She can also try AZO standard.  1. Bladder pain  - Urinalysis, Complete - BLADDER SCAN AMB NON-IMAGING   No follow-ups on file.  Reece Packer, MD  [place] [address], [place], [place] [address] [phone]

## 2019-08-11 LAB — CULTURE, URINE COMPREHENSIVE

## 2019-08-26 ENCOUNTER — Ambulatory Visit: Payer: Self-pay | Admitting: Physician Assistant

## 2019-09-28 ENCOUNTER — Ambulatory Visit (INDEPENDENT_AMBULATORY_CARE_PROVIDER_SITE_OTHER): Payer: Commercial Managed Care - PPO | Admitting: Obstetrics & Gynecology

## 2019-09-28 ENCOUNTER — Other Ambulatory Visit: Payer: Self-pay | Admitting: Obstetrics & Gynecology

## 2019-09-28 ENCOUNTER — Encounter: Payer: Self-pay | Admitting: Obstetrics & Gynecology

## 2019-09-28 ENCOUNTER — Telehealth: Payer: Self-pay | Admitting: Obstetrics & Gynecology

## 2019-09-28 ENCOUNTER — Other Ambulatory Visit: Payer: Self-pay

## 2019-09-28 VITALS — BP 130/80 | Ht 63.0 in | Wt 238.0 lb

## 2019-09-28 DIAGNOSIS — Z780 Asymptomatic menopausal state: Secondary | ICD-10-CM

## 2019-09-28 DIAGNOSIS — Z01419 Encounter for gynecological examination (general) (routine) without abnormal findings: Secondary | ICD-10-CM

## 2019-09-28 DIAGNOSIS — Z1231 Encounter for screening mammogram for malignant neoplasm of breast: Secondary | ICD-10-CM | POA: Diagnosis not present

## 2019-09-28 MED ORDER — CLOTRIMAZOLE-BETAMETHASONE 1-0.05 % EX CREA: 1.0000 "application " | TOPICAL_CREAM | Freq: Two times a day (BID) | CUTANEOUS | 0 refills | Status: DC

## 2019-09-28 MED ORDER — ESTRADIOL 1 MG PO TABS: ORAL_TABLET | ORAL | 12 refills | Status: DC

## 2019-09-28 MED ORDER — ESTRADIOL 1 MG PO TABS: ORAL_TABLET | ORAL | 3 refills | Status: DC

## 2019-09-28 NOTE — Telephone Encounter (Signed)
Left message to advise pt of 30 day supply

## 2019-09-28 NOTE — Telephone Encounter (Signed)
Let her know changed to 30 day supply

## 2019-09-28 NOTE — Telephone Encounter (Signed)
Patient is calling due to her medication being a 90 days supply needing it to be an 30 days supply on her Hormone medication insurance will deny it. Please advise

## 2019-09-28 NOTE — Progress Notes (Signed)
HPI:      [name] is a 54 y.o. [id number] who LMP was in the past, she presents today for her annual examination.  The patient has no complaints today. The patient is sexually active, and has some external pain w intercourse on several occasions (no visible lesion seen by pt). Herlast pap: approximate date 2019 and was normal and last mammogram: approximate date 2020 and was normal.  The patient does perform self breast exams.  There is notable family history of breast or ovarian cancer in her family. MyRisk Neg, T-C 11%.  Mother died w Ovarian Cancer. She has had prior TLH BSO.   The patient is taking hormone replacement therapy. Patient denies post-menopausal vaginal bleeding.   The patient has regular exercise: yes. The patient denies current symptoms of depression.    GYN Hx: Last Colonoscopy:1 year ago. Normal.  Last DEXA: never ago.    PMHx: Past Medical History:  Diagnosis Date   Anxiety and depression    BRCA negative 10/2017   MyRisk neg except CDKN2A VUS; IBIS=9.3%   Family history of ovarian cancer    MyRisk neg   GERD (gastroesophageal reflux disease)    H/O nutritional disorder 08/01/2014   Headache, migraine 04/06/2014   Hemorrhoids    IBS (irritable bowel syndrome)    Leiomyoma of uterus    Lumbar radiculopathy 06/17/2014   Overview:  S/p bilateral lumbar decompression L4-5 and L5-S1 s/p excision of large central/left disc herniation 09/16/08 with resolution of symptoms recurrent in 2016 with dx of L5-S1 radiculopathy per Dr. Elmer Sow  [place], [facility]. Report scanned.    Menorrhagia    Stomach ulcer    Past Surgical History:  Procedure Laterality Date   ABDOMINAL HYSTERECTOMY     BACK SURGERY  2010   CESAREAN SECTION  2002   CHOLECYSTECTOMY  2010   COLONOSCOPY     ECTOPIC PREGNANCY SURGERY  2001, 2004   OOPHORECTOMY     TOTAL LAPAROSCOPIC HYSTERECTOMY WITH SALPINGECTOMY  2014   TUBAL LIGATION  2004   Family History  Problem Relation Age  of Onset   Breast cancer Other    Kidney failure Mother    Ovarian cancer Mother 46       gene neg   Breast cancer Paternal Aunt        pat great aunt   Bladder Cancer Neg Hx    Kidney cancer Neg Hx    Prostate cancer Neg Hx    BRCA 1/2 Neg Hx    Social History   Tobacco Use   Smoking status: Former Smoker   Smokeless tobacco: Never Used  Scientific laboratory technician Use: Never used  Substance Use Topics   Alcohol use: Yes    Alcohol/week: 0.0 standard drinks    Comment: occasionally   Drug use: No    Current Outpatient Medications:    Cholecalciferol (VITAMIN D3) 2000 units capsule, Take by mouth., Disp: , Rfl:    estradiol (ESTRACE) 1 MG tablet, TAKE 1 TABLET(1 MG) BY MOUTH DAILY, Disp: 90 tablet, Rfl: 3   oxybutynin (DITROPAN-XL) 10 MG 24 hr tablet, Take 1 tablet (10 mg total) by mouth daily., Disp: 30 tablet, Rfl: 11   SUMAtriptan (IMITREX) 50 MG tablet, Reported on 08/18/2015, Disp: , Rfl:    topiramate (TOPAMAX) 50 MG tablet, TAKE 1 TABLET (50 MG TOTAL) BY MOUTH ONCE DAILY., Disp: , Rfl: 4 Allergies: Latex, Other, and Shellfish allergy  Review of Systems  Constitutional: Negative for  chills, fever and malaise/fatigue.  HENT: Negative for congestion, sinus pain and sore throat.   Eyes: Negative for blurred vision and pain.  Respiratory: Negative for cough and wheezing.   Cardiovascular: Negative for chest pain and leg swelling.  Gastrointestinal: Negative for abdominal pain, constipation, diarrhea, heartburn, nausea and vomiting.  Genitourinary: Negative for dysuria, frequency, hematuria and urgency.  Musculoskeletal: Negative for back pain, joint pain, myalgias and neck pain.  Skin: Negative for itching and rash.  Neurological: Negative for dizziness, tremors and weakness.  Endo/Heme/Allergies: Does not bruise/bleed easily.  Psychiatric/Behavioral: Negative for depression. The patient is not nervous/anxious and does not have insomnia.     Objective: BP  130/80    Ht '5\' 3"'$  (1.6 m)    Wt 238 lb (108 kg)    LMP 07/10/2012    BMI 42.16 kg/m   Filed Weights   09/28/19 0900  Weight: 238 lb (108 kg)   Body mass index is 42.16 kg/m. Physical Exam Constitutional:      General: She is not in acute distress.    Appearance: She is well-developed.  Genitourinary:     Pelvic exam was performed with patient supine.     Vagina and rectum normal.     No lesions in the vagina.     No vaginal bleeding.     No right or left adnexal mass present.     Right adnexa not tender.     Left adnexa not tender.     Genitourinary Comments: Absent Uterus Absent cervix Vaginal cuff well healed No ext lesions  HENT:     Head: Normocephalic and atraumatic. No laceration.     Right Ear: Hearing normal.     Left Ear: Hearing normal.     Mouth/Throat:     Pharynx: Uvula midline.  Eyes:     Pupils: Pupils are equal, round, and reactive to light.  Neck:     Thyroid: No thyromegaly.  Cardiovascular:     Rate and Rhythm: Normal rate and regular rhythm.     Heart sounds: No murmur heard.  No friction rub. No gallop.   Pulmonary:     Effort: Pulmonary effort is normal. No respiratory distress.     Breath sounds: Normal breath sounds. No wheezing.  Chest:     Breasts:        Right: No mass, skin change or tenderness.        Left: No mass, skin change or tenderness.  Abdominal:     General: Bowel sounds are normal. There is no distension.     Palpations: Abdomen is soft.     Tenderness: There is no abdominal tenderness. There is no rebound.  Musculoskeletal:        General: Normal range of motion.     Cervical back: Normal range of motion and neck supple.  Neurological:     Mental Status: She is alert and oriented to person, place, and time.     Cranial Nerves: No cranial nerve deficit.  Skin:    General: Skin is warm and dry.  Psychiatric:        Judgment: Judgment normal.  Vitals reviewed.     Assessment: Annual Exam 1. Women's annual routine  gynecological examination   2. Encounter for screening mammogram for malignant neoplasm of breast     Plan:            1.  Cervical Screening-  Pap smear not done today, plan next year  2. Breast screening-  Exam annually and mammogram scheduled  3. Colonoscopy every 10 years, Hemoccult testing after age 36  4. Labs UTD  Labs due next year  5. Counseling for hormonal therapy: no change in therapy today              6. FRAX - FRAX score for assessing the 10 year probability for fracture calculated and discussed today.  Based on age and score today, DEXA is not currently scheduled.  Lotrisone for ext irritation and pain w intercourse Wt loss advised    F/U  Return in about 1 year (around 09/27/2020) for Annual.  Barnett Applebaum, MD, [facility], [facility] 09/28/2019  9:13 AM

## 2019-09-28 NOTE — Patient Instructions (Signed)
Mammogram every year    Call [phone] to schedule at [facility] Colonoscopy every 10 years Labs yearly (with PCP)  Thank you for choosing [facility]. As part of our ongoing efforts to improve patient experience, we would appreciate your feedback. Please fill out the short survey that you will receive by mail or MyChart. Your opinion is important to [place]!

## 2019-10-28 ENCOUNTER — Ambulatory Visit
Admission: RE | Admit: 2019-10-28 | Discharge: 2019-10-28 | Disposition: A | Discharge status: Home / Self Care | Payer: Commercial Managed Care - PPO | Source: Ambulatory Visit | Attending: Obstetrics & Gynecology | Admitting: Obstetrics & Gynecology

## 2019-10-28 ENCOUNTER — Other Ambulatory Visit: Payer: Self-pay

## 2019-10-28 DIAGNOSIS — Z1231 Encounter for screening mammogram for malignant neoplasm of breast: Secondary | ICD-10-CM | POA: Diagnosis present

## 2019-12-13 DIAGNOSIS — M25561 Pain in right knee: Secondary | ICD-10-CM | POA: Insufficient documentation

## 2020-01-03 DIAGNOSIS — R112 Nausea with vomiting, unspecified: Secondary | ICD-10-CM | POA: Insufficient documentation

## 2020-03-06 ENCOUNTER — Ambulatory Visit: Payer: Commercial Managed Care - PPO | Admitting: Urology

## 2020-03-06 ENCOUNTER — Other Ambulatory Visit: Payer: Self-pay

## 2020-03-06 VITALS — BP 138/86 | HR 69

## 2020-03-06 DIAGNOSIS — N3946 Mixed incontinence: Secondary | ICD-10-CM

## 2020-03-06 MED ORDER — VIBEGRON 75 MG PO TABS: 75.0000 mg | ORAL_TABLET | Freq: Every day | ORAL | 11 refills | Status: DC

## 2020-03-06 MED ORDER — OXYBUTYNIN CHLORIDE ER 10 MG PO TB24: 10.0000 mg | ORAL_TABLET | Freq: Every day | ORAL | 11 refills | Status: DC

## 2020-03-06 NOTE — Progress Notes (Signed)
03/06/2020 10:36 AM   Katrina Munoz March 08, 1966 [id number]  Referring provider: Hortencia Pilar, MD [address], [address] [place],  [place] [address]  No chief complaint on file.   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 She leaks with coughing and sneezing and sometimes bending and lifting. There is no question she can leak without awareness. She also has urgency incontinence and denies enuresis and is failed Kegel exercises and Miraberon. She really started Vesicare in its helping some. She wears 3-4 pads a day sometimes damp sometimes moderately wet  The last time the patient was here she had a grade 2 hypermobility of the bladder neck and a positive cough test. She had a grade 1 rectocele.   On urodynamics the patient had a reduced bladder capacity with bladder overactivity and a mild bladder outlet abnormality  Felt that she had a moderate overactive bladder with a mild to moderate outlet abnormality. Felt both were significant. I gave her oxybutynin last time.We then discussed the sling in detail with my usual template.  The patient is almost dry. Sometimes she leaks without awareness. If she gets a bad cold she will have stress incontinence. She truly thinks he oxybutynin is helped a lot with less urgency and frequency.   Continues to be very happy with the oxybutynin once a day. Still has residual stress incontinence and does not wish to have surgery. She is jogging a lot now and very happy.  Patient was doing very well on oxybutynin until yesterday.  She drank a soda and thought she was having burping and gas.  She then was having significant discomfort near the pubic bone but not in the vagina.  It steady.  It is worse this morning.  No burning in the vagina.  No cloudiness or foul-smelling urine.  She has to go more frequently or constantly and this is much worse than her baseline.  She has never had a CT  scan  Patient points to low suprapubic area but I could not feel any tenderness.  Pelvic examination was limited but normal.  I think the patient has a bladder infection though the urine looked normal.  I started her on Septra DS 1 tablet twice a day for a week.  Call if urine culture differs.  See nurse practitioner in the next week to make certain pain went away.  Clinically she is presenting as a bladder infection with urgency and frequency and pain  Because of the acute onset even though the pain was in the midline I decide to get a CT stone protocol tomorrow and I will call her if she had a stone.  She can also try AZO standard.  Today Urine culture from last visit in May negative.  Never had CT scan.  Never saw a nurse practitioner.  Frequency is stable.  The patient's pain went away last time so she did not follow-up for get the x-ray.  In the last month she has higher volume urge incontinence with no cystitis symptoms.  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  The episodes can be high-volume.  Frequency stable.      PMH: Past Medical History:  Diagnosis Date  . Anxiety and depression   . BRCA negative 10/2017   MyRisk neg except CDKN2A VUS; IBIS=9.3%  . Family history of ovarian cancer    MyRisk neg  . GERD (gastroesophageal reflux disease)   .  H/O nutritional disorder 08/01/2014  . Headache, migraine 04/06/2014  . Hemorrhoids   . IBS (irritable bowel syndrome)   . Leiomyoma of uterus   . Lumbar radiculopathy 06/17/2014   Overview:  S/p bilateral lumbar decompression L4-5 and L5-S1 s/p excision of large central/left disc herniation 09/16/08 with resolution of symptoms recurrent in 2016 with dx of L5-S1 radiculopathy per Dr. Elmer Sow  [place], [facility]. Report scanned.   . Menorrhagia   . Stomach ulcer     Surgical History: Past Surgical History:  Procedure Laterality Date  . ABDOMINAL HYSTERECTOMY    . BACK SURGERY  2010  . CESAREAN SECTION  2002  .  CHOLECYSTECTOMY  2010  . COLONOSCOPY    . ECTOPIC PREGNANCY SURGERY  2001, 2004  . OOPHORECTOMY    . TOTAL LAPAROSCOPIC HYSTERECTOMY WITH SALPINGECTOMY  2014  . TUBAL LIGATION  2004    Home Medications:  Allergies as of 03/06/2020      Reactions   Latex Rash   Other Anaphylaxis   Uncoded Allergy. Allergen: DARVON Uncoded Allergy. Allergen: darvocet   Shellfish Allergy Shortness Of Breath      Medication List       Accurate as of March 06, 2020 10:36 AM. If you have any questions, ask your nurse or doctor.        clotrimazole-betamethasone cream Commonly known as: Lotrisone Apply 1 application topically 2 (two) times daily.   estradiol 1 MG tablet Commonly known as: ESTRACE One pill po daily   Imitrex 50 MG tablet Generic drug: SUMAtriptan Reported on 08/18/2015   oxybutynin 10 MG 24 hr tablet Commonly known as: DITROPAN-XL Take 1 tablet (10 mg total) by mouth daily.   topiramate 50 MG tablet Commonly known as: TOPAMAX TAKE 1 TABLET (50 MG TOTAL) BY MOUTH ONCE DAILY.   Vitamin D3 50 MCG (2000 UT) capsule Take by mouth.       Allergies:  Allergies  Allergen Reactions  . Latex Rash  . Other Anaphylaxis    Uncoded Allergy. Allergen: DARVON Uncoded Allergy. Allergen: darvocet  . Shellfish Allergy Shortness Of Breath    Family History: Family History  Problem Relation Age of Onset  . Breast cancer Other   . Kidney failure Mother   . Ovarian cancer Mother 81       gene neg  . Breast cancer Paternal Aunt        pat great aunt  . Bladder Cancer Neg Hx   . Kidney cancer Neg Hx   . Prostate cancer Neg Hx   . BRCA 1/2 Neg Hx     Social History:  reports that she has quit smoking. She has never used smokeless tobacco. She reports current alcohol use. She reports that she does not use drugs.  ROS:                                        Physical Exam: LMP 07/10/2012   Constitutional:  Alert and oriented, No acute  distress. HEENT: [place] AT, moist mucus membranes.  Trachea midline, no masses.   Laboratory Data: Lab Results  Component Value Date   WBC 8.7 01/25/2013   HGB 9.4 (L) 02/10/2013   HCT 37.7 01/25/2013   MCV 92 01/25/2013   PLT 332 01/25/2013    Lab Results  Component Value Date   CREATININE 0.73 01/25/2013    No results found for: PSA  No results found for: TESTOSTERONE  Lab Results  Component Value Date   HGBA1C 5.1 09/04/2017    Urinalysis    Component Value Date/Time   APPEARANCEUR Clear 08/09/2019 1122   GLUCOSEU Negative 08/09/2019 1122   BILIRUBINUR Negative 08/09/2019 1122   PROTEINUR Negative 08/09/2019 1122   NITRITE Negative 08/09/2019 1122   LEUKOCYTESUR Negative 08/09/2019 1122    Pertinent Imaging:   Assessment & Plan: Clinically patient was not infected but I sent urine for culture.  She is has gained some weight and this may help explain her worsening urge incontinence.  The more I spoke to her she is reasonably happy on the oxybutynin.  I gave her the new beta 3 agonist with samples and co-pay card.  I hand-delivered oxybutynin.  If she fails the new medication she will go back on oxybutynin and see me in 1 year.  I mentioned 3 refractory therapies but she is not can pursue them at this time  There are no diagnoses linked to this encounter.  No follow-ups on file.  Reece Packer, MD  [facility] [address], [place], [place] [address] [phone]

## 2020-03-07 LAB — MICROSCOPIC EXAMINATION
Bacteria, UA: NONE SEEN
RBC: NONE SEEN /hpf (ref 0–2)

## 2020-03-07 LAB — URINALYSIS, COMPLETE
Bilirubin, UA: NEGATIVE
Glucose, UA: NEGATIVE
Ketones, UA: NEGATIVE
Leukocytes,UA: NEGATIVE
Nitrite, UA: NEGATIVE
Protein,UA: NEGATIVE
RBC, UA: NEGATIVE
Specific Gravity, UA: 1.025 (ref 1.005–1.030)
Urobilinogen, Ur: 0.2 mg/dL (ref 0.2–1.0)
pH, UA: 5 (ref 5.0–7.5)

## 2020-03-12 LAB — CULTURE, URINE COMPREHENSIVE

## 2020-09-18 ENCOUNTER — Other Ambulatory Visit: Payer: Self-pay | Admitting: Obstetrics & Gynecology

## 2020-09-18 DIAGNOSIS — Z780 Asymptomatic menopausal state: Secondary | ICD-10-CM

## 2020-10-06 ENCOUNTER — Other Ambulatory Visit: Payer: Self-pay | Admitting: Obstetrics & Gynecology

## 2020-10-06 DIAGNOSIS — Z780 Asymptomatic menopausal state: Secondary | ICD-10-CM

## 2020-10-17 ENCOUNTER — Ambulatory Visit: Payer: Commercial Managed Care - PPO | Admitting: Obstetrics & Gynecology

## 2020-10-17 ENCOUNTER — Other Ambulatory Visit: Payer: Self-pay

## 2020-10-17 DIAGNOSIS — Z780 Asymptomatic menopausal state: Secondary | ICD-10-CM

## 2020-10-17 MED ORDER — ESTRADIOL 1 MG PO TABS: ORAL_TABLET | ORAL | 0 refills | Status: DC

## 2020-10-17 NOTE — Telephone Encounter (Signed)
Pt calling; missed appt today; needs refill for estradiol to get her to appt 8/17th.  [phone]  Pt aware refill eRx'd.

## 2020-11-08 ENCOUNTER — Other Ambulatory Visit: Payer: Self-pay | Admitting: Physician Assistant

## 2020-11-08 ENCOUNTER — Ambulatory Visit (INDEPENDENT_AMBULATORY_CARE_PROVIDER_SITE_OTHER): Payer: Commercial Managed Care - PPO | Admitting: Obstetrics & Gynecology

## 2020-11-08 ENCOUNTER — Other Ambulatory Visit (HOSPITAL_COMMUNITY)
Admission: RE | Admit: 2020-11-08 | Discharge: 2020-11-08 | Disposition: A | Discharge status: Home / Self Care | Payer: Commercial Managed Care - PPO | Source: Ambulatory Visit | Attending: Obstetrics & Gynecology | Admitting: Obstetrics & Gynecology

## 2020-11-08 ENCOUNTER — Encounter: Payer: Self-pay | Admitting: Obstetrics & Gynecology

## 2020-11-08 ENCOUNTER — Other Ambulatory Visit: Payer: Self-pay

## 2020-11-08 VITALS — BP 130/80 | Ht 61.0 in | Wt 233.0 lb

## 2020-11-08 DIAGNOSIS — Z1211 Encounter for screening for malignant neoplasm of colon: Secondary | ICD-10-CM

## 2020-11-08 DIAGNOSIS — Z1272 Encounter for screening for malignant neoplasm of vagina: Secondary | ICD-10-CM | POA: Diagnosis present

## 2020-11-08 DIAGNOSIS — Z1231 Encounter for screening mammogram for malignant neoplasm of breast: Secondary | ICD-10-CM | POA: Diagnosis not present

## 2020-11-08 DIAGNOSIS — Z01419 Encounter for gynecological examination (general) (routine) without abnormal findings: Secondary | ICD-10-CM | POA: Diagnosis not present

## 2020-11-08 DIAGNOSIS — Z780 Asymptomatic menopausal state: Secondary | ICD-10-CM | POA: Diagnosis not present

## 2020-11-08 MED ORDER — ESTRADIOL 1 MG PO TABS: ORAL_TABLET | ORAL | 12 refills | Status: DC

## 2020-11-08 NOTE — Patient Instructions (Signed)
PAP every three years Mammogram every year    Call [phone] to schedule at [facility] Colonoscopy every 10 years Labs yearly (with PCP)  Thank you for choosing [facility]. As part of our ongoing efforts to improve patient experience, we would appreciate your feedback. Please fill out the short survey that you will receive by mail or MyChart. Your opinion is important to us! - Dr. Skylyn Slezak  Recommendations to boost your immunity to prevent illness such as viral flu and colds, including covid19, are as follows:       - - -  Vitamin K2 and Vitamin D3  - - - Take Vitamin K2 at 200-300 mcg daily (usually 2-3 pills daily of the over the counter formulation). Take Vitamin D3 at 3000-4000 U daily (usually 3-4 pills daily of the over the counter formulation). Studies show that these two at high normal levels in your system are very effective in keeping your immunity so strong and protective that you will be unlikely to contract viral illness such as those listed above.  Dr Rawley Harju  

## 2020-11-08 NOTE — Progress Notes (Signed)
HPI:      [name] is a 55 y.o. [id number] who LMP was in the past, she presents today for her annual examination.  The patient has no complaints today. The patient is sexually active. Herlast pap: approximate date 2019 and was normal and last mammogram: approximate date 2021 and was normal.  The patient does perform self breast exams.  There is notable family history of breast or ovarian cancer in her family. The patient is taking hormone replacement therapy. Patient denies post-menopausal vaginal bleeding.   The patient has regular exercise: yes. The patient denies current symptoms of depression.    GYN Hx: Last Colonoscopy:4 years ago. Normal.   PMHx: Past Medical History:  Diagnosis Date   Anxiety and depression    BRCA negative 10/2017   MyRisk neg except CDKN2A VUS; IBIS=9.3%   Family history of ovarian cancer    MyRisk neg   GERD (gastroesophageal reflux disease)    H/O nutritional disorder 08/01/2014   Headache, migraine 04/06/2014   Hemorrhoids    IBS (irritable bowel syndrome)    Leiomyoma of uterus    Lumbar radiculopathy 06/17/2014   Overview:  S/p bilateral lumbar decompression L4-5 and L5-S1 s/p excision of large central/left disc herniation 09/16/08 with resolution of symptoms recurrent in 2016 with dx of L5-S1 radiculopathy per Dr. Elmer Sow  [place], [facility]. Report scanned.    Menorrhagia    Stomach ulcer    Past Surgical History:  Procedure Laterality Date   ABDOMINAL HYSTERECTOMY     BACK SURGERY  2010   CESAREAN SECTION  2002   CHOLECYSTECTOMY  2010   COLONOSCOPY     ECTOPIC PREGNANCY SURGERY  2001, 2004   OOPHORECTOMY     TOTAL LAPAROSCOPIC HYSTERECTOMY WITH SALPINGECTOMY  2014   TUBAL LIGATION  2004   Family History  Problem Relation Age of Onset   Breast cancer Other    Kidney failure Mother    Ovarian cancer Mother 77       gene neg   Breast cancer Paternal Aunt        pat great aunt   Bladder Cancer Neg Hx    Kidney cancer Neg Hx    Prostate  cancer Neg Hx    BRCA 1/2 Neg Hx    Social History   Tobacco Use   Smoking status: Former   Smokeless tobacco: Never  Scientific laboratory technician Use: Never used  Substance Use Topics   Alcohol use: Yes    Alcohol/week: 0.0 standard drinks    Comment: occasionally   Drug use: No    Current Outpatient Medications:    albuterol (VENTOLIN HFA) 108 (90 Base) MCG/ACT inhaler, Inhale into the lungs., Disp: , Rfl:    Cholecalciferol (VITAMIN D3) 2000 units capsule, Take by mouth., Disp: , Rfl:    escitalopram (LEXAPRO) 5 MG tablet, Take 5 mg by mouth daily., Disp: , Rfl:    fluticasone (FLONASE) 50 MCG/ACT nasal spray, Place into the nose., Disp: , Rfl:    oxybutynin (DITROPAN-XL) 10 MG 24 hr tablet, Take 1 tablet (10 mg total) by mouth daily., Disp: 30 tablet, Rfl: 11   SUMAtriptan (IMITREX) 50 MG tablet, Reported on 08/18/2015, Disp: , Rfl:    topiramate (TOPAMAX) 50 MG tablet, TAKE 1 TABLET (50 MG TOTAL) BY MOUTH ONCE DAILY., Disp: , Rfl: 4   Vibegron 75 MG TABS, Take 75 mg by mouth daily., Disp: 30 tablet, Rfl: 11   estradiol (ESTRACE) 1 MG tablet,  TAKE 1 TABLET BY MOUTH DAILY, Disp: 30 tablet, Rfl: 12 Allergies: Latex, Other, Shellfish allergy, Poison ivy extract, Poison oak extract, and Poison sumac extract  Review of Systems  Constitutional:  Positive for malaise/fatigue. Negative for chills and fever.  HENT:  Positive for tinnitus. Negative for congestion, sinus pain and sore throat.   Eyes:  Negative for blurred vision and pain.  Respiratory:  Positive for shortness of breath. Negative for cough and wheezing.   Cardiovascular:  Negative for chest pain and leg swelling.  Gastrointestinal:  Negative for abdominal pain, constipation, diarrhea, heartburn, nausea and vomiting.  Genitourinary:  Negative for dysuria, frequency, hematuria and urgency.  Musculoskeletal:  Positive for joint pain. Negative for back pain, myalgias and neck pain.  Skin:  Negative for itching and rash.   Neurological:  Positive for dizziness and headaches. Negative for tremors and weakness.  Endo/Heme/Allergies:  Does not bruise/bleed easily.  Psychiatric/Behavioral:  Negative for depression. The patient is nervous/anxious. The patient does not have insomnia.    Objective: BP 130/80   Ht _0  (1.549 m)   Wt 233 lb (105.7 kg)   LMP 07/10/2012   BMI 44.02 kg/m   Filed Weights   11/08/20 1421  Weight: 233 lb (105.7 kg)   Body mass index is 44.02 kg/m. Physical Exam Constitutional:      General: She is not in acute distress.    Appearance: She is well-developed.  Genitourinary:     Vulva, bladder, rectum and urethral meatus normal.     No lesions in the vagina.     Genitourinary Comments: Vaginal cuff well healed     Right Labia: No rash, tenderness or lesions.    Left Labia: No tenderness, lesions or rash.    No vaginal bleeding.      Right Adnexa: not tender and no mass present.    Left Adnexa: not tender and no mass present.    Cervix is absent.     Uterus is absent.     Pelvic exam was performed with patient in the lithotomy position.  Breasts:    Right: No mass, skin change or tenderness.     Left: No mass, skin change or tenderness.  HENT:     Head: Normocephalic and atraumatic. No laceration.     Right Ear: Hearing normal.     Left Ear: Hearing normal.     Mouth/Throat:     Pharynx: Uvula midline.  Eyes:     Pupils: Pupils are equal, round, and reactive to light.  Neck:     Thyroid: No thyromegaly.  Cardiovascular:     Rate and Rhythm: Normal rate and regular rhythm.     Heart sounds: No murmur heard.   No friction rub. No gallop.  Pulmonary:     Effort: Pulmonary effort is normal. No respiratory distress.     Breath sounds: Normal breath sounds. No wheezing.  Abdominal:     General: Bowel sounds are normal. There is no distension.     Palpations: Abdomen is soft.     Tenderness: There is no abdominal tenderness. There is no rebound.  Musculoskeletal:         General: Normal range of motion.     Cervical back: Normal range of motion and neck supple.  Neurological:     Mental Status: She is alert and oriented to person, place, and time.     Cranial Nerves: No cranial nerve deficit.  Skin:    General: Skin is warm and  dry.  Psychiatric:        Judgment: Judgment normal.  Vitals reviewed.    Assessment: Annual Exam 1. Screen for colon cancer   2. Menopause   3. Encounter for screening mammogram for malignant neoplasm of breast   4. Women's annual routine gynecological examination   5. Screening for vaginal cancer     Plan:            1.  Vaginal Screening-  Pap smear done today  2. Breast screening- Exam annually and mammogram scheduled  3. Colonoscopy every 10 years, Hemoccult testing after age 64  4. Labs managed by PCP  5. Counseling for hormonal therapy: no change in therapy today Cont Estrace 1 mg daily              6. FRAX - FRAX score for assessing the 10 year probability for fracture calculated and discussed today.  Based on age and score today, DEXA is not currently scheduled.   7. Vitamins recommended.  Tumeric for joints.  D and K.    F/U  Return in about 1 year (around 11/08/2021) for Annual.  Barnett Applebaum, MD, [facility], [facility] 11/08/2020  3:11 PM

## 2020-11-10 LAB — CYTOLOGY - PAP: Diagnosis: NEGATIVE

## 2020-11-22 ENCOUNTER — Ambulatory Visit
Admission: RE | Admit: 2020-11-22 | Discharge: 2020-11-22 | Disposition: A | Discharge status: Home / Self Care | Payer: Commercial Managed Care - PPO | Source: Ambulatory Visit | Attending: Obstetrics & Gynecology | Admitting: Obstetrics & Gynecology

## 2020-11-22 ENCOUNTER — Other Ambulatory Visit: Payer: Self-pay

## 2020-11-22 DIAGNOSIS — Z1231 Encounter for screening mammogram for malignant neoplasm of breast: Secondary | ICD-10-CM | POA: Diagnosis present

## 2020-12-11 DIAGNOSIS — F419 Anxiety disorder, unspecified: Secondary | ICD-10-CM | POA: Insufficient documentation

## 2021-02-11 ENCOUNTER — Encounter: Payer: Self-pay | Admitting: Emergency Medicine

## 2021-02-11 ENCOUNTER — Other Ambulatory Visit: Payer: Self-pay

## 2021-02-11 ENCOUNTER — Ambulatory Visit
Admission: EM | Admit: 2021-02-11 | Discharge: 2021-02-11 | Disposition: A | Discharge status: Home / Self Care | Payer: Commercial Managed Care - PPO | Attending: Emergency Medicine | Admitting: Emergency Medicine

## 2021-02-11 DIAGNOSIS — J069 Acute upper respiratory infection, unspecified: Secondary | ICD-10-CM | POA: Insufficient documentation

## 2021-02-11 DIAGNOSIS — Z20822 Contact with and (suspected) exposure to covid-19: Secondary | ICD-10-CM | POA: Diagnosis not present

## 2021-02-11 LAB — RESP PANEL BY RT-PCR (FLU A&B, COVID) ARPGX2
Influenza A by PCR: NEGATIVE
Influenza B by PCR: NEGATIVE
SARS Coronavirus 2 by RT PCR: NEGATIVE

## 2021-02-11 MED ORDER — PROMETHAZINE-DM 6.25-15 MG/5ML PO SYRP: 5.0000 mL | ORAL_SOLUTION | Freq: Four times a day (QID) | ORAL | 0 refills | Status: DC | PRN

## 2021-02-11 MED ORDER — BENZONATATE 100 MG PO CAPS: 200.0000 mg | ORAL_CAPSULE | Freq: Three times a day (TID) | ORAL | 0 refills | Status: DC

## 2021-02-11 MED ORDER — IPRATROPIUM BROMIDE 0.06 % NA SOLN: 2.0000 | Freq: Four times a day (QID) | NASAL | 12 refills | Status: DC

## 2021-02-11 NOTE — Discharge Instructions (Signed)
Use the Atrovent nasal spray, 2 squirts in each nostril every 6 hours, as needed for runny nose and postnasal drip.  Use the Tessalon Perles every 8 hours during the day.  Take them with a small sip of water.  They may give you some numbness to the base of your tongue or a metallic taste in your mouth, this is normal.  Use the Promethazine DM cough syrup at bedtime for cough and congestion.  It will make you drowsy so do not take it during the day.  Finish your prescribed Cipro and Flagyl for your diverticulitis and dental infection.   Return for reevaluation or see your primary care provider for any new or worsening symptoms.

## 2021-02-11 NOTE — ED Provider Notes (Signed)
[facility]    CSN: [id number] Arrival date & time: 02/11/21  9741      History   Chief Complaint Chief Complaint  Patient presents with   Cough   Abdominal Pain    HPI Katrina Munoz is a 55 y.o. female.   HPI  55 year old female here for evaluation of respiratory complaints.  Patient reports that 3 days ago she was in the [facility] ER and diagnosed with diverticulitis.  She was started on Cipro and Flagyl.  After she left she developed a cough, runny nose, and chest congestion.  She is reporting clear nasal discharge, sore throat, and clear sputum production when she coughs.  She has not had a fever or ear pain.  She has continued to have some abdominal pain in the left lower quadrant from her diverticulitis.  Past Medical History:  Diagnosis Date   Anxiety and depression    BRCA negative 10/2017   MyRisk neg except CDKN2A VUS; IBIS=9.3%   Family history of ovarian cancer    MyRisk neg   GERD (gastroesophageal reflux disease)    H/O nutritional disorder 08/01/2014   Headache, migraine 04/06/2014   Hemorrhoids    IBS (irritable bowel syndrome)    Leiomyoma of uterus    Lumbar radiculopathy 06/17/2014   Overview:  S/p bilateral lumbar decompression L4-5 and L5-S1 s/p excision of large central/left disc herniation 09/16/08 with resolution of symptoms recurrent in 2016 with dx of L5-S1 radiculopathy per Dr. Elmer Sow  [place], [facility]. Report scanned.    Menorrhagia    Stomach ulcer     Patient Active Problem List   Diagnosis Date Noted   Acute left ankle pain 02/03/2018   Stable angina pectoris ([place]) 08/26/2016   Gastroesophageal reflux disease without esophagitis 08/01/2016   Hyperglycemia 08/01/2016   Family history of ovarian cancer 07/10/2016   Incontinence 09/11/2015   Stress incontinence, female 09/11/2015   S/P hysterectomy 08/02/2015   H/O nutritional disorder 08/01/2014   Chronic lumbar radiculopathy 06/17/2014   Chronic migraine 04/06/2014    Past  Surgical History:  Procedure Laterality Date   ABDOMINAL HYSTERECTOMY     BACK SURGERY  2010   CESAREAN SECTION  2002   CHOLECYSTECTOMY  2010   COLONOSCOPY     ECTOPIC PREGNANCY SURGERY  2001, 2004   OOPHORECTOMY     TOTAL LAPAROSCOPIC HYSTERECTOMY WITH SALPINGECTOMY  2014   TUBAL LIGATION  2004    OB History     Gravida  5   Para  3   Term  3   Preterm      AB  2   Living  3      SAB      IAB      Ectopic  2   Multiple      Live Births               Home Medications    Prior to Admission medications   Medication Sig Start Date End Date Taking? Authorizing Provider  benzonatate (TESSALON) 100 MG capsule Take 2 capsules (200 mg total) by mouth every 8 (eight) hours. 02/11/21  Yes Margarette Canada, NP  Cholecalciferol (VITAMIN D3) 2000 units capsule Take 2,000 Units by mouth daily. 2 tablets   Yes [provider]  ciprofloxacin (CIPRO) 500 MG tablet Take by mouth. 02/08/21 02/13/21 Yes [provider]  escitalopram (LEXAPRO) 5 MG tablet Take 5 mg by mouth daily. 11/03/20  Yes [provider]  estradiol (ESTRACE) 1  MG tablet TAKE 1 TABLET BY MOUTH DAILY 11/08/20  Yes Gae Dry, MD  ipratropium (ATROVENT) 0.06 % nasal spray Place 2 sprays into both nostrils 4 (four) times daily. 02/11/21  Yes Margarette Canada, NP  metroNIDAZOLE (FLAGYL) 500 MG tablet Take by mouth. 02/08/21 02/13/21 Yes [provider]  promethazine-dextromethorphan (PROMETHAZINE-DM) 6.25-15 MG/5ML syrup Take 5 mLs by mouth 4 (four) times daily as needed. 02/11/21  Yes Margarette Canada, NP  SUMAtriptan (IMITREX) 50 MG tablet Reported on 08/18/2015 08/01/14  Yes [provider]  topiramate (TOPAMAX) 50 MG tablet TAKE 1 TABLET (50 MG TOTAL) BY MOUTH ONCE DAILY. 05/20/15  Yes [provider]  albuterol (VENTOLIN HFA) 108 (90 Base) MCG/ACT inhaler Inhale into the lungs. 11/03/19   [provider]  fluticasone (FLONASE) 50 MCG/ACT nasal spray Place  into the nose. 11/03/19   [provider]  Vibegron (GEMTESA) 75 MG TABS Take 1 tablet by mouth daily. 07/10/20   [provider]  Vibegron 75 MG TABS Take 75 mg by mouth daily. 03/06/20   Bjorn Loser, MD    Family History Family History  Problem Relation Age of Onset   Breast cancer Other    Kidney failure Mother    Ovarian cancer Mother 18       gene neg   Breast cancer Paternal Aunt        pat great aunt   Bladder Cancer Neg Hx    Kidney cancer Neg Hx    Prostate cancer Neg Hx    BRCA 1/2 Neg Hx     Social History Social History   Tobacco Use   Smoking status: Former   Smokeless tobacco: Never  Scientific laboratory technician Use: Never used  Substance Use Topics   Alcohol use: Yes    Alcohol/week: 0.0 standard drinks    Comment: occasionally   Drug use: No     Allergies   Latex, Other, Shellfish allergy, Poison ivy extract, Poison oak extract, and Poison sumac extract   Review of Systems Review of Systems  Constitutional:  Negative for activity change, appetite change and fever.  HENT:  Positive for congestion, rhinorrhea and sore throat. Negative for ear pain.   Respiratory:  Positive for cough. Negative for shortness of breath and wheezing.   Gastrointestinal:  Positive for abdominal pain. Negative for blood in stool.  Hematological: Negative.     Physical Exam Triage Vital Signs ED Triage Vitals  Enc Vitals Group     BP 02/11/21 1159 (!) 129/95     Pulse Rate 02/11/21 1159 67     Resp 02/11/21 1159 16     Temp 02/11/21 1159 98.3 F (36.8 C)     Temp Source 02/11/21 1159 Oral     SpO2 02/11/21 1159 97 %     Weight 02/11/21 1154 233 lb 0.4 oz (105.7 kg)     Height 02/11/21 1154 _0  (1.549 m)     Head Circumference --      Peak Flow --      Pain Score 02/11/21 1154 6     Pain Loc --      Pain Edu? --      Excl. in [place]? --    No data found.  Updated Vital Signs BP (!) 129/95 (BP Location: Right Arm)   Pulse 67   Temp 98.3 F  (36.8 C) (Oral)   Resp 16   Ht _1  (1.549 m)   Wt 233 lb 0.4 oz (  105.7 kg)   LMP 07/10/2012   SpO2 97%   BMI 44.03 kg/m   Visual Acuity Right Eye Distance:   Left Eye Distance:   Bilateral Distance:    Right Eye Near:   Left Eye Near:    Bilateral Near:     Physical Exam Vitals and nursing note reviewed.  Constitutional:      General: She is not in acute distress.    Appearance: Normal appearance. She is not ill-appearing.  HENT:     Head: Normocephalic and atraumatic.     Right Ear: Tympanic membrane, ear canal and external ear normal. There is no impacted cerumen.     Left Ear: Tympanic membrane, ear canal and external ear normal. There is no impacted cerumen.     Nose: Congestion and rhinorrhea present.     Mouth/Throat:     Mouth: Mucous membranes are moist.     Pharynx: Oropharynx is clear. Posterior oropharyngeal erythema present.  Cardiovascular:     Rate and Rhythm: Normal rate and regular rhythm.     Pulses: Normal pulses.     Heart sounds: Normal heart sounds. No murmur heard.   No gallop.  Pulmonary:     Effort: Pulmonary effort is normal.     Breath sounds: Normal breath sounds. No wheezing, rhonchi or rales.  Musculoskeletal:     Cervical back: Normal range of motion and neck supple.  Lymphadenopathy:     Cervical: No cervical adenopathy.  Skin:    General: Skin is warm and dry.     Capillary Refill: Capillary refill takes less than 2 seconds.     Findings: No erythema or rash.  Neurological:     General: No focal deficit present.     Mental Status: She is alert and oriented to person, place, and time.  Psychiatric:        Mood and Affect: Mood normal.        Behavior: Behavior normal.        Thought Content: Thought content normal.        Judgment: Judgment normal.     UC Treatments / Results  Labs (all labs ordered are listed, but only abnormal results are displayed) Labs Reviewed  RESP PANEL BY RT-PCR (FLU A&B, COVID) [id number]     EKG   Radiology No results found.  Procedures Procedures (including critical care time)  Medications Ordered in UC Medications - No data to display  Initial Impression / Assessment and Plan / UC Course  I have reviewed the triage vital signs and the nursing notes.  Pertinent labs & imaging results that were available during my care of the patient were reviewed by me and considered in my medical decision making (see chart for details).  Patient is a nontoxic-appearing 55 year old female here for evaluation of respiratory complaints as outlined in HPI above.  Patient's physical exam reveals pearly gray tympanic membranes bilaterally with normal light reflex and clear external auditory canals.  Nasal mucosa is erythematous and edematous with clear nasal discharge.  Oropharyngeal exam reveals posterior oropharyngeal erythema and injection with clear postnasal drip.  No cervical lymphadenopathy appreciated on exam.  Cardiopulmonary exam reveals clear lung sounds in all fields.  Patient is continue to have some left lower quadrant abdominal pain that she reports has not improved nor gotten worse since being diagnosed with diverticulitis.  She states that she did not take her antibiotics yesterday but she was symptoms today.  I advised her that she should finish her  antibiotic therapy and that it may take a few days for the discomfort to improve or resolve.  I told her that if the pain increases, she develops fever, or she develops blood in her stool, she needs to go back to the emergency department for reevaluation.  Patient was swabbed in triage with a respiratory triplex panel that is negative for influenza and COVID.  Patient's exam is consistent with a viral respiratory infection with a cough.  We will treat with Atrovent nasal spray, Tessalon Perles, and Promethazine DM cough syrup.  Patient is already on a fluoroquinolone which would treat any bacterial upper respiratory infection that is  present.  Patient vies to return for reevaluation for new or worsening symptoms.   Final Clinical Impressions(s) / UC Diagnoses   Final diagnoses:  Viral URI with cough     Discharge Instructions      Use the Atrovent nasal spray, 2 squirts in each nostril every 6 hours, as needed for runny nose and postnasal drip.  Use the Tessalon Perles every 8 hours during the day.  Take them with a small sip of water.  They may give you some numbness to the base of your tongue or a metallic taste in your mouth, this is normal.  Use the Promethazine DM cough syrup at bedtime for cough and congestion.  It will make you drowsy so do not take it during the day.  Finish your prescribed Cipro and Flagyl for your diverticulitis and dental infection.   Return for reevaluation or see your primary care provider for any new or worsening symptoms.      ED Prescriptions     Medication Sig Dispense Auth. Provider   benzonatate (TESSALON) 100 MG capsule Take 2 capsules (200 mg total) by mouth every 8 (eight) hours. 21 capsule Margarette Canada, NP   ipratropium (ATROVENT) 0.06 % nasal spray Place 2 sprays into both nostrils 4 (four) times daily. 15 mL Margarette Canada, NP   promethazine-dextromethorphan (PROMETHAZINE-DM) 6.25-15 MG/5ML syrup Take 5 mLs by mouth 4 (four) times daily as needed. 118 mL Margarette Canada, NP      PDMP not reviewed this encounter.   Margarette Canada, NP 02/11/21 1257

## 2021-02-11 NOTE — ED Triage Notes (Signed)
Patient was seen at [facility] on 11/17/ and diagnosed with Diverticulitis.  Patient also reports having cough, runny nose and chest congestion since Thursday.  Patient denies recent fevers.

## 2021-03-12 ENCOUNTER — Other Ambulatory Visit: Payer: Self-pay

## 2021-03-12 ENCOUNTER — Ambulatory Visit: Payer: Commercial Managed Care - PPO | Admitting: Urology

## 2021-03-12 ENCOUNTER — Encounter: Payer: Self-pay | Admitting: Urology

## 2021-03-12 VITALS — BP 138/81 | HR 91 | Ht 61.0 in | Wt 233.0 lb

## 2021-03-12 DIAGNOSIS — N3946 Mixed incontinence: Secondary | ICD-10-CM

## 2021-03-12 MED ORDER — GEMTESA 75 MG PO TABS: 1.0000 | ORAL_TABLET | Freq: Every day | ORAL | 11 refills | Status: DC

## 2021-03-12 NOTE — Progress Notes (Signed)
03/12/2021 10:55 AM   Glendale Chard [id number]  Referring provider: Hortencia Pilar, MD [address], [address] [place],  [place] [address]  No chief complaint on file.   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 She leaks with coughing and sneezing and sometimes bending and lifting. There is no question she can leak without awareness. She also has urgency incontinence and denies enuresis and is failed Kegel exercises and Miraberon. She really started Vesicare in its helping some. She wears 3-4 pads a day sometimes damp sometimes moderately wet   The last time the patient was here she had a grade 2 hypermobility of the bladder neck and a positive cough test. She had a grade 1 rectocele.    On urodynamics the patient had a reduced bladder capacity with bladder overactivity and a mild bladder outlet abnormality   Felt that she had a moderate overactive bladder with a mild to moderate outlet abnormality. Felt both were significant. I gave her oxybutynin last time. We then discussed the sling in detail with my usual template.   The patient is almost dry. Sometimes she leaks without awareness. If she gets a bad cold she will have stress incontinence. She truly thinks he oxybutynin is helped a lot with less urgency and frequency.    Continues to be very happy with the oxybutynin once a day.  Still has residual stress incontinence and does not wish to have surgery.  She is jogging a lot now and very happy.   Urine culture from last visit in May negative.  Never had CT scan.  Never saw a nurse practitioner.  Frequency is stable.  The patient's pain went away last time so she did not follow-up for get the x-ray.   In the last month she has higher volume urge incontinence with no cystitis symptoms.  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  The episodes can be high-volume.    Clinically patient was not  infected but I sent urine for culture.  She is has gained some weight and this may help explain her worsening urge incontinence.  The more I spoke to her she is reasonably happy on the oxybutynin.  I gave her the new beta 3 agonist with samples and co-pay card.  I hand-delivered oxybutynin.  If she fails the new medication she will go back on oxybutynin and see me in 1 year.  I mentioned 3 refractory therapies but she is not can pursue them at this time  Today On regular days she is basically continent on the [place] unless she holds it too long.  She is very happy.  When she is sick with coughing sneezing she could use 5 pads a day.  No infections.  Does not want to pursue surgery since her father who is a diabetic is having issues with amputations and she is the primary caregiver    PMH: Past Medical History:  Diagnosis Date   Anxiety and depression    BRCA negative 10/2017   MyRisk neg except CDKN2A VUS; IBIS=9.3%   Family history of ovarian cancer    MyRisk neg   GERD (gastroesophageal reflux disease)    H/O nutritional disorder 08/01/2014   Headache, migraine 04/06/2014   Hemorrhoids    IBS (irritable bowel syndrome)    Leiomyoma of uterus    Lumbar radiculopathy 06/17/2014   Overview:  S/p bilateral lumbar decompression L4-5 and  L5-S1 s/p excision of large central/left disc herniation 09/16/08 with resolution of symptoms recurrent in 2016 with dx of L5-S1 radiculopathy per Dr. Elmer Sow  [place], [facility] Report scanned.    Menorrhagia    Stomach ulcer     Surgical History: Past Surgical History:  Procedure Laterality Date   ABDOMINAL HYSTERECTOMY     BACK SURGERY  2010   CESAREAN SECTION  2002   CHOLECYSTECTOMY  2010   COLONOSCOPY     ECTOPIC PREGNANCY SURGERY  2001, 2004   OOPHORECTOMY     TOTAL LAPAROSCOPIC HYSTERECTOMY WITH SALPINGECTOMY  2014   TUBAL LIGATION  2004    Home Medications:  Allergies as of 03/12/2021       Reactions   Latex Rash   Other Anaphylaxis    Uncoded Allergy. Allergen: DARVON Uncoded Allergy. Allergen: darvocet   Shellfish Allergy Shortness Of Breath   Poison Ivy Extract Dermatitis, Hives, Itching, Rash, Swelling   Poison Oak Extract Dermatitis, Hives, Itching, Rash, Swelling   Poison Sumac Extract Dermatitis, Hives, Itching, Rash, Swelling        Medication List        Accurate as of March 12, 2021 10:55 AM. If you have any questions, ask your nurse or doctor.          albuterol 108 (90 Base) MCG/ACT inhaler Commonly known as: VENTOLIN HFA Inhale into the lungs.   benzonatate 100 MG capsule Commonly known as: TESSALON Take 2 capsules (200 mg total) by mouth every 8 (eight) hours.   escitalopram 5 MG tablet Commonly known as: LEXAPRO Take 5 mg by mouth daily.   estradiol 1 MG tablet Commonly known as: ESTRACE TAKE 1 TABLET BY MOUTH DAILY   fluticasone 50 MCG/ACT nasal spray Commonly known as: FLONASE Place into the nose.   ipratropium 0.06 % nasal spray Commonly known as: ATROVENT Place 2 sprays into both nostrils 4 (four) times daily.   promethazine-dextromethorphan 6.25-15 MG/5ML syrup Commonly known as: PROMETHAZINE-DM Take 5 mLs by mouth 4 (four) times daily as needed.   SUMAtriptan 50 MG tablet Commonly known as: IMITREX Reported on 08/18/2015   topiramate 50 MG tablet Commonly known as: TOPAMAX TAKE 1 TABLET (50 MG TOTAL) BY MOUTH ONCE DAILY.   Vibegron 75 MG Tabs Take 75 mg by mouth daily.   Gemtesa 75 MG Tabs Generic drug: Vibegron Take 1 tablet by mouth daily.   Vitamin D3 50 MCG (2000 UT) capsule Take 2,000 Units by mouth daily. 2 tablets        Allergies:  Allergies  Allergen Reactions   Latex Rash   Other Anaphylaxis    Uncoded Allergy. Allergen: DARVON Uncoded Allergy. Allergen: darvocet   Shellfish Allergy Shortness Of Breath   Poison Ivy Extract Dermatitis, Hives, Itching, Rash and Swelling   Poison Oak Extract Dermatitis, Hives, Itching, Rash and Swelling    Poison Sumac Extract Dermatitis, Hives, Itching, Rash and Swelling    Family History: Family History  Problem Relation Age of Onset   Breast cancer Other    Kidney failure Mother    Ovarian cancer Mother 49       gene neg   Breast cancer Paternal Aunt        pat great aunt   Bladder Cancer Neg Hx    Kidney cancer Neg Hx    Prostate cancer Neg Hx    BRCA 1/2 Neg Hx     Social History:  reports that she has quit smoking. She has never used smokeless  tobacco. She reports current alcohol use. She reports that she does not use drugs.  ROS:                                        Physical Exam: LMP 07/10/2012   Constitutional:  Alert and oriented, No acute distress. HEENT: [place] AT, moist mucus membranes.  Trachea midline, no masses. Cardiovascular: No clubbing, cyanosis, or edema.   Laboratory Data: Lab Results  Component Value Date   WBC 8.7 01/25/2013   HGB 9.4 (L) 02/10/2013   HCT 37.7 01/25/2013   MCV 92 01/25/2013   PLT 332 01/25/2013    Lab Results  Component Value Date   CREATININE 0.73 01/25/2013    No results found for: PSA  No results found for: TESTOSTERONE  Lab Results  Component Value Date   HGBA1C 5.1 09/04/2017    Urinalysis    Component Value Date/Time   APPEARANCEUR Clear 03/06/2020 1106   GLUCOSEU Negative 03/06/2020 1106   BILIRUBINUR Negative 03/06/2020 1106   PROTEINUR Negative 03/06/2020 1106   NITRITE Negative 03/06/2020 1106   LEUKOCYTESUR Negative 03/06/2020 1106    Pertinent Imaging:   Assessment & Plan: Prescription resent and I will see in 1 year  There are no diagnoses linked to this encounter.  No follow-ups on file.  Reece Packer, MD  [facility] [address] [place], [place] [address] [phone]

## 2021-04-09 ENCOUNTER — Telehealth: Payer: Self-pay

## 2021-04-09 NOTE — Telephone Encounter (Signed)
PA pending on cover my meds. PA # [id number]   KEY on cover my meds [id number]

## 2021-04-11 NOTE — Telephone Encounter (Signed)
Katrina Munoz was denied by pt insurance.

## 2021-04-13 MED ORDER — OXYBUTYNIN CHLORIDE ER 10 MG PO TB24: 10.0000 mg | ORAL_TABLET | Freq: Every day | ORAL | 11 refills | Status: DC

## 2021-04-13 NOTE — Telephone Encounter (Signed)
Lm for pt to return my call.

## 2021-04-13 NOTE — Telephone Encounter (Signed)
Spoke with patient. Oxybutynin sent to [facility].

## 2021-04-13 NOTE — Addendum Note (Signed)
Addended by: Alvera Novel on: 04/13/2021 09:49 AM   Modules accepted: Orders

## 2021-06-17 ENCOUNTER — Ambulatory Visit (INDEPENDENT_AMBULATORY_CARE_PROVIDER_SITE_OTHER): Payer: Commercial Managed Care - PPO

## 2021-06-17 ENCOUNTER — Other Ambulatory Visit: Payer: Self-pay

## 2021-06-17 ENCOUNTER — Ambulatory Visit
Admission: EM | Admit: 2021-06-17 | Discharge: 2021-06-17 | Disposition: A | Discharge status: Home / Self Care | Payer: Commercial Managed Care - PPO | Attending: Physician Assistant | Admitting: Physician Assistant

## 2021-06-17 DIAGNOSIS — J4541 Moderate persistent asthma with (acute) exacerbation: Secondary | ICD-10-CM | POA: Insufficient documentation

## 2021-06-17 DIAGNOSIS — R059 Cough, unspecified: Secondary | ICD-10-CM | POA: Diagnosis not present

## 2021-06-17 DIAGNOSIS — Z20822 Contact with and (suspected) exposure to covid-19: Secondary | ICD-10-CM | POA: Diagnosis not present

## 2021-06-17 DIAGNOSIS — R0602 Shortness of breath: Secondary | ICD-10-CM | POA: Diagnosis not present

## 2021-06-17 DIAGNOSIS — R051 Acute cough: Secondary | ICD-10-CM | POA: Diagnosis not present

## 2021-06-17 DIAGNOSIS — R062 Wheezing: Secondary | ICD-10-CM | POA: Diagnosis not present

## 2021-06-17 LAB — RESP PANEL BY RT-PCR (FLU A&B, COVID) ARPGX2
Influenza A by PCR: NEGATIVE
Influenza B by PCR: NEGATIVE
SARS Coronavirus 2 by RT PCR: NEGATIVE

## 2021-06-17 MED ORDER — PREDNISONE 20 MG PO TABS: 40.0000 mg | ORAL_TABLET | Freq: Every day | ORAL | 0 refills | Status: AC

## 2021-06-17 MED ORDER — BENZONATATE 100 MG PO CAPS: 100.0000 mg | ORAL_CAPSULE | Freq: Three times a day (TID) | ORAL | 0 refills | Status: DC

## 2021-06-17 MED ORDER — BUDESONIDE-FORMOTEROL FUMARATE 80-4.5 MCG/ACT IN AERO: 2.0000 | INHALATION_SPRAY | Freq: Two times a day (BID) | RESPIRATORY_TRACT | 0 refills | Status: DC

## 2021-06-17 NOTE — ED Provider Notes (Signed)
?[facility] ? ? ? ?CSN: [id number] ?Arrival date & time: 06/17/21  4098 ? ? ?  ? ?History   ?Chief Complaint ?Chief Complaint  ?Patient presents with  ? Cough  ? ? ?HPI ?Katrina Munoz is a 56 y.o. female.  ? ?Patient presents today with a 4-day history of cough and shortness of breath symptoms.  She reports this is the third episode in the past 2 months.  She has been seen by multiple providers but continues to have recurrent episodes.  She does have a history of asthma managed with albuterol as needed.  She has been using albuterol much more frequently with persistent symptoms.  She has not taken a maintenance medication.  She denies any recent antibiotics.  Denies any recent steroid use.  Denies history of diabetes but does have a family history of this condition.  She has tried DayQuil, NyQuil, over-the-counter medications without improvement.  She was tested for COVID and flu with last episode of symptoms 10th and negative.  She does report that symptoms improved until recurrence a few days ago. ? ? ?Past Medical History:  ?Diagnosis Date  ? Anxiety and depression   ? BRCA negative 10/2017  ? MyRisk neg except CDKN2A VUS; IBIS=9.3%  ? Family history of ovarian cancer   ? MyRisk neg  ? GERD (gastroesophageal reflux disease)   ? H/O nutritional disorder 08/01/2014  ? Headache, migraine 04/06/2014  ? Hemorrhoids   ? IBS (irritable bowel syndrome)   ? Leiomyoma of uterus   ? Lumbar radiculopathy 06/17/2014  ? Overview:  S/p bilateral lumbar decompression L4-5 and L5-S1 s/p excision of large central/left disc herniation 09/16/08 with resolution of symptoms recurrent in 2016 with dx of L5-S1 radiculopathy per Dr. Elmer Sow  [place], [facility]. Report scanned.   ? Menorrhagia   ? Stomach ulcer   ? ? ?Patient Active Problem List  ? Diagnosis Date Noted  ? Anxiety 12/11/2020  ? Nausea and vomiting in adult 01/03/2020  ? Right knee pain 12/13/2019  ? Poison ivy 11/02/2018  ? Peroneal tendinitis 03/02/2018  ? Acute left  ankle pain 02/03/2018  ? 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 Gastroesophageal reflux disease without esophagitis 08/01/2016  ? Hyperglycemia 08/01/2016  ? Family history of ovarian cancer 07/10/2016  ? Incontinence 09/11/2015  ? Stress incontinence, female 09/11/2015  ? S/P hysterectomy 08/02/2015  ? H/O nutritional disorder 08/01/2014  ? Chronic lumbar radiculopathy 06/17/2014  ? Chronic migraine 04/06/2014  ? ? ?Past Surgical History:  ?Procedure Laterality Date  ? ABDOMINAL HYSTERECTOMY    ? BACK SURGERY  2010  ? CESAREAN SECTION  2002  ? CHOLECYSTECTOMY  2010  ? COLONOSCOPY    ? ECTOPIC PREGNANCY SURGERY  2001, 2004  ? OOPHORECTOMY    ? TOTAL LAPAROSCOPIC HYSTERECTOMY WITH SALPINGECTOMY  2014  ? TUBAL LIGATION  2004  ? ? ?OB History   ? ? Gravida  ?5  ? Para  ?3  ? Term  ?3  ? Preterm  ?   ? AB  ?2  ? Living  ?3  ?  ? ? SAB  ?   ? IAB  ?   ? Ectopic  ?2  ? Multiple  ?   ? Live Births  ?   ?   ?  ?  ? ? ? ?Home Medications   ? ?Prior to Admission medications   ?Medication Sig Start Date End Date Taking? Authorizing Provider  ?albuterol (VENTOLIN HFA) 108 (90 Base) MCG/ACT inhaler  Inhale into the lungs every 6 (six) hours as needed for wheezing or shortness of breath. Before bed.   Yes [provider]  ?benzonatate (TESSALON) 100 MG capsule Take 1 capsule (100 mg total) by mouth every 8 (eight) hours. 06/17/21  Yes Cordelro Gautreau, Junie Panning K, PA-C  ?budesonide-formoterol (SYMBICORT) 80-4.5 MCG/ACT inhaler Inhale 2 puffs into the lungs in the morning and at bedtime. 06/17/21  Yes Edelmira Gallogly, Derry Skill, PA-C  ?Cholecalciferol (VITAMIN D3) 2000 units capsule Take 2,000 Units by mouth daily. 2 tablets   Yes [provider]  ?escitalopram (LEXAPRO) 10 MG tablet Take 10 mg by mouth daily. 03/06/21  Yes [provider]  ?escitalopram (LEXAPRO) 5 MG tablet Take 5 mg by mouth daily.   Yes [provider]  ?estradiol (ESTRACE) 1 MG tablet TAKE 1 TABLET BY MOUTH DAILY 11/08/20  Yes Gae Dry, MD   ?meloxicam (MOBIC) 15 MG tablet Take 15 mg by mouth daily. 03/06/21  Yes [provider]  ?oxybutynin (DITROPAN-XL) 10 MG 24 hr tablet Take 1 tablet (10 mg total) by mouth daily. 04/13/21  Yes MacDiarmid, Nicki Reaper, MD  ?predniSONE (DELTASONE) 20 MG tablet Take 2 tablets (40 mg total) by mouth daily for 5 days. 06/17/21 06/22/21 Yes Joneisha Miles, Derry Skill, PA-C  ?SUMAtriptan (IMITREX) 50 MG tablet Reported on 08/18/2015 08/01/14  Yes [provider]  ?topiramate (TOPAMAX) 50 MG tablet TAKE 1 TABLET (50 MG TOTAL) BY MOUTH ONCE DAILY. 05/20/15  Yes [provider]  ?Vitamin K (AQUA-K) 200 MCG/0.2ML LIQD Take by mouth.   Yes [provider]  ? ? ?Family History ?Family History  ?Problem Relation Age of Onset  ? Breast cancer Other   ? Kidney failure Mother   ? Ovarian cancer Mother 69  ?     gene neg  ? Breast cancer Paternal Aunt   ?     pat great aunt  ? Bladder Cancer Neg Hx   ? Kidney cancer Neg Hx   ? Prostate cancer Neg Hx   ? BRCA 1/2 Neg Hx   ? ? ?Social History ?Social History  ? ?Tobacco Use  ? Smoking status: Former  ? Smokeless tobacco: Never  ?Vaping Use  ? Vaping Use: Never used  ?Substance Use Topics  ? Alcohol use: Yes  ?  Alcohol/week: 0.0 standard drinks  ?  Comment: occasionally  ? Drug use: No  ? ? ? ?Allergies   ?Latex, Other, Propoxyphene, Shellfish allergy, Poison ivy extract, Poison oak extract, and Poison sumac extract ? ? ?Review of Systems ?Review of Systems  ?Constitutional:  Positive for activity change, fatigue and fever. Negative for appetite change.  ?HENT:  Positive for congestion, sinus pressure and sore throat. Negative for sneezing.   ?Respiratory:  Positive for cough, chest tightness and shortness of breath.   ?Cardiovascular:  Negative for chest pain.  ?Gastrointestinal:  Negative for abdominal pain, diarrhea, nausea and vomiting.  ?Neurological:  Positive for headaches. Negative for dizziness and light-headedness.  ? ? ?Physical Exam ?Triage Vital Signs ?ED  Triage Vitals  ?Enc Vitals Group  ?   BP 06/17/21 1008 (!) 138/91  ?   Pulse Rate 06/17/21 1008 74  ?   Resp 06/17/21 1008 (!) 24  ?   Temp 06/17/21 1008 99.7 ?F (37.6 ?C)  ?   Temp Source 06/17/21 1008 Oral  ?   SpO2 06/17/21 1008 95 %  ?   Weight 06/17/21 1005 235 lb (106.6 kg)  ?   Height 06/17/21 1005  5' 1.5" (1.562 m)  ?   Head Circumference --   ?   Peak Flow --   ?   Pain Score 06/17/21 1003 0  ?   Pain Loc --   ?   Pain Edu? --   ?   Excl. in [place]? --   ? ?No data found. ? ?Updated Vital Signs ?BP (!) 138/91 (BP Location: Right Arm)   Pulse 74   Temp 99.7 ?F (37.6 ?C) (Oral)   Resp (!) 24   Ht 5' 1.5" (1.562 m)   Wt 235 lb (106.6 kg)   LMP 07/10/2012   SpO2 95%   BMI 43.68 kg/m?  ? ?Visual Acuity ?Right Eye Distance:   ?Left Eye Distance:   ?Bilateral Distance:   ? ?Right Eye Near:   ?Left Eye Near:    ?Bilateral Near:    ? ?Physical Exam ?Vitals reviewed.  ?Constitutional:   ?   General: She is awake. She is not in acute distress. ?   Appearance: Normal appearance. She is well-developed. She is not ill-appearing.  ?   Comments: Very pleasant female appears stated age in no acute distress sitting comfortably in exam room  ?HENT:  ?   Head: Normocephalic and atraumatic.  ?   Right Ear: Tympanic membrane, ear canal and external ear normal. Tympanic membrane is not erythematous or bulging.  ?   Left Ear: Tympanic membrane, ear canal and external ear normal. Tympanic membrane is not erythematous or bulging.  ?   Nose:  ?   Right Sinus: No maxillary sinus tenderness or frontal sinus tenderness.  ?   Left Sinus: No maxillary sinus tenderness or frontal sinus tenderness.  ?   Mouth/Throat:  ?   Pharynx: Uvula midline. Posterior oropharyngeal erythema present. No oropharyngeal exudate.  ?Cardiovascular:  ?   Rate and Rhythm: Normal rate and regular rhythm.  ?   Heart sounds: Normal heart sounds, S1 normal and S2 normal. No murmur heard. ?Pulmonary:  ?   Effort: Pulmonary effort is normal.  ?   Breath sounds:  Wheezing present. No rhonchi or rales.  ?   Comments: Wheezing and reactive cough with deep breathing ?Musculoskeletal:  ?   Right lower leg: No edema.  ?   Left lower leg: No edema.  ?Psychiatric:     ?   Behavior

## 2021-06-17 NOTE — Discharge Instructions (Signed)
Your x-ray showed no evidence of pneumonia.  I will contact you if your lab work is abnormal and you are positive for COVID or flu.  Please start Symbicort twice daily.  Make sure to rinse your mouth after using this medication to prevent thrush.  Use your albuterol inhaler as needed.  Start prednisone burst in the morning.  Do not take NSAIDs including aspirin, ibuprofen/Advil, naproxen/Aleve with this medication as it can cause stomach bleeding.  Use Mucinex, Flonase, Tylenol for additional symptom relief.  Use Tessalon for cough.  Make sure you rest and drink plenty of fluid.  If anything worsens and you develop worsening cough, shortness of breath, high fever not responding to medication you should be seen immediately.  Follow-up with your PCP as scheduled. ?

## 2021-06-17 NOTE — ED Triage Notes (Signed)
Patient is here for "Cough". This will be "#3" of the same thing. Now sob. Wheezing. Saturday "woke up with ha". Now sinus pain/pressure. No fever.  ?

## 2021-06-28 ENCOUNTER — Other Ambulatory Visit: Payer: Self-pay | Admitting: Obstetrics & Gynecology

## 2021-06-28 ENCOUNTER — Telehealth: Payer: Self-pay

## 2021-06-28 ENCOUNTER — Encounter: Payer: Self-pay | Admitting: Obstetrics & Gynecology

## 2021-06-28 NOTE — Telephone Encounter (Signed)
I sent her a MyChart message

## 2021-06-28 NOTE — Telephone Encounter (Signed)
Pt calling; [facility] has been her doctor and he is leaving; who is he referring her to?  States they talked about her going off estradiol to see if she has any sxs of postmenopause b/c she's had a total hyst; has been dealing with a lot of other things; doesn't know if she needs to talk to [facility] before he leaves or to who he is going to refer her to; is having pretty significant hot flashes; does she go off estradiol now?  Please call.  [phone] ?

## 2021-11-12 NOTE — Progress Notes (Unsigned)
PCP: Gardiner Coins, PA-C   Chief Complaint  Patient presents with   Gynecologic Exam    Internal vag itching, irritation x couple of days.     HPI:      [name] is a 56 y.o. [id number] whose LMP was Patient's last menstrual period was 07/10/2012., presents today for her annual examination.  Her menses are absent due to [facility] due to [facility] 2014. She has been on estrace 1 mg daily since hyst with sx control. Last yr she started having hot flashes, sx improved recently. Is under increased stress. Normal TSH 8/22  Sex activity: single partner, contraception - status post hysterectomy. She does not have vaginal dryness.  Has had irritated area RT labia for 4 days. No new soaps/detergents, no recent abx use. Wears pads daily due to SUI. No meds to treat. Hurt with sex recently. No increased vag d/c.  Pt with SUI, rarely urge incont. Hasn't done pelvic PT.   Last Pap: 11/08/20  Results were: no abnormalities  Last mammogram: 11/22/20  Results were: normal--routine follow-up in 12 months There is no FH of breast cancer. There is a FH of ovarian cancer in her mother (mother is BRCA neg). Pt is MyRisk neg except CDKN2A VUS 2019. The patient does do self-breast exams.  Colonoscopy: 9/20 at [facility], hx of polyps; Repeat due after ?3-5 years.   Tobacco use: The patient denies current or previous tobacco use. Alcohol use: rare No drug use Exercise: min active  She does get adequate calcium and Vitamin D in her diet. Hx of Vit D deficiency, taking 4,000 IU D3 daily. Due for lab recheck.   Labs with PCP.   Patient Active Problem List   Diagnosis Date Noted   Anxiety 12/11/2020   Nausea and vomiting in adult 01/03/2020   Right knee pain 12/13/2019   Poison ivy 11/02/2018   Peroneal tendinitis 03/02/2018   Acute left ankle pain 02/03/2018   Stable angina pectoris ([facility]) 08/26/2016   Gastroesophageal reflux disease without esophagitis 08/01/2016   Hyperglycemia 08/01/2016    Family history of ovarian cancer 07/10/2016   Incontinence 09/11/2015   SUI (stress urinary incontinence, female) 09/11/2015   S/P hysterectomy 08/02/2015   H/O nutritional disorder 08/01/2014   Chronic lumbar radiculopathy 06/17/2014   Chronic migraine 04/06/2014    Past Surgical History:  Procedure Laterality Date   ABDOMINAL HYSTERECTOMY     BACK SURGERY  2010   CESAREAN SECTION  2002   CHOLECYSTECTOMY  2010   COLONOSCOPY     ECTOPIC PREGNANCY SURGERY  2001, 2004   OOPHORECTOMY     TOTAL LAPAROSCOPIC HYSTERECTOMY WITH SALPINGECTOMY  2014   TUBAL LIGATION  2004    Family History  Problem Relation Age of Onset   Kidney failure Mother    Ovarian cancer Mother 55       gene neg   Breast cancer Paternal Aunt        pat great aunt   Breast cancer Other    Bladder Cancer Neg Hx    Kidney cancer Neg Hx    Prostate cancer Neg Hx    BRCA 1/2 Neg Hx     Social History   Socioeconomic History   Marital status: Married    Spouse name: Not on file   Number of children: Not on file   Years of education: Not on file   Highest education level: Not on file  Occupational History   Not on file  Tobacco  Use   Smoking status: Former   Smokeless tobacco: Never  Vaping Use   Vaping Use: Never used  Substance and Sexual Activity   Alcohol use: Yes    Alcohol/week: 0.0 standard drinks of alcohol    Comment: occasionally   Drug use: No   Sexual activity: Yes  Other Topics Concern   Not on file  Social History Narrative   Not on file   Social Determinants of Health   Financial Resource Strain: Not on file  Food Insecurity: Not on file  Transportation Needs: Not on file  Physical Activity: Not on file  Stress: Not on file  Social Connections: Not on file  Intimate Partner Violence: Not on file     Current Outpatient Medications:    albuterol (VENTOLIN HFA) 108 (90 Base) MCG/ACT inhaler, Inhale into the lungs every 6 (six) hours as needed for wheezing or shortness of  breath. Before bed., Disp: , Rfl:    Cholecalciferol (VITAMIN D3) 2000 units capsule, Take 2,000 Units by mouth daily. 2 tablets, Disp: , Rfl:    escitalopram (LEXAPRO) 10 MG tablet, Take 10 mg by mouth daily., Disp: , Rfl:    escitalopram (LEXAPRO) 5 MG tablet, Take 5 mg by mouth daily., Disp: , Rfl:    ipratropium (ATROVENT) 0.03 % nasal spray, Place 2 sprays into both nostrils 2 (two) times daily., Disp: , Rfl:    meloxicam (MOBIC) 15 MG tablet, Take 15 mg by mouth daily., Disp: , Rfl:    montelukast (SINGULAIR) 10 MG tablet, Take 10 mg by mouth daily., Disp: , Rfl:    oxybutynin (DITROPAN-XL) 10 MG 24 hr tablet, Take 1 tablet (10 mg total) by mouth daily., Disp: 30 tablet, Rfl: 11   SUMAtriptan (IMITREX) 50 MG tablet, Reported on 08/18/2015, Disp: , Rfl:    SYMBICORT 160-4.5 MCG/ACT inhaler, Inhale 2 puffs into the lungs 2 (two) times daily., Disp: , Rfl:    topiramate (TOPAMAX) 50 MG tablet, TAKE 1 TABLET (50 MG TOTAL) BY MOUTH ONCE DAILY., Disp: , Rfl: 4   TURMERIC PO, Take by mouth., Disp: , Rfl:    Vitamin K (AQUA-K) 200 MCG/0.2ML LIQD, Take by mouth., Disp: , Rfl:    estradiol (ESTRACE) 1 MG tablet, TAKE 1 TABLET BY MOUTH DAILY, Disp: 90 tablet, Rfl: 3     ROS:  Review of Systems  Constitutional:  Negative for fatigue, fever and unexpected weight change.  Respiratory:  Negative for cough, shortness of breath and wheezing.   Cardiovascular:  Negative for chest pain, palpitations and leg swelling.  Gastrointestinal:  Negative for blood in stool, constipation, diarrhea, nausea and vomiting.  Endocrine: Negative for cold intolerance, heat intolerance and polyuria.  Genitourinary:  Negative for dyspareunia, dysuria, flank pain, frequency, genital sores, hematuria, menstrual problem, pelvic pain, urgency, vaginal bleeding, vaginal discharge and vaginal pain.  Musculoskeletal:  Negative for back pain, joint swelling and myalgias.  Skin:  Negative for rash.  Neurological:  Negative for  dizziness, syncope, light-headedness, numbness and headaches.  Hematological:  Negative for adenopathy.  Psychiatric/Behavioral:  Negative for agitation, confusion, sleep disturbance and suicidal ideas. The patient is not nervous/anxious.    BREAST: No symptoms    Objective: BP (!) 130/90   Ht $R'5\' 1"'qv$  (1.549 m)   Wt 250 lb (113.4 kg)   LMP 07/10/2012   BMI 47.24 kg/m    Physical Exam Constitutional:      Appearance: She is well-developed.  Genitourinary:     Vulva normal.  Genitourinary Comments: UTERUS/CX SURG REM     Right Labia: rash.     Right Labia: No tenderness or lesions.    Left Labia: No tenderness, lesions or rash.       Vaginal cuff intact.    No vaginal discharge, erythema or tenderness.      Right Adnexa: not tender and no mass present.    Left Adnexa: not tender and no mass present.    Cervix is absent.     Uterus is absent.  Breasts:    Right: No mass, nipple discharge, skin change or tenderness.     Left: No mass, nipple discharge, skin change or tenderness.  Neck:     Thyroid: No thyromegaly.  Cardiovascular:     Rate and Rhythm: Normal rate and regular rhythm.     Heart sounds: Normal heart sounds. No murmur heard. Pulmonary:     Effort: Pulmonary effort is normal.     Breath sounds: Normal breath sounds.  Abdominal:     Palpations: Abdomen is soft.     Tenderness: There is no abdominal tenderness. There is no guarding.  Musculoskeletal:        General: Normal range of motion.     Cervical back: Normal range of motion.  Neurological:     General: No focal deficit present.     Mental Status: She is alert and oriented to person, place, and time.     Cranial Nerves: No cranial nerve deficit.  Skin:    General: Skin is warm and dry.  Psychiatric:        Mood and Affect: Mood normal.        Behavior: Behavior normal.        Thought Content: Thought content normal.        Judgment: Judgment normal.  Vitals reviewed.     Assessment/Plan:  Encounter for annual routine gynecological examination  Encounter for screening mammogram for malignant neoplasm of breast - Plan: MM 3D SCREEN BREAST BILATERAL; pt to schedule mammo  Vasomotor symptoms due to menopause - Plan: estradiol (ESTRACE) 1 MG tablet; Rx RF. Increased sx most likely due to stress. Increase exercise.   Hormone replacement therapy (HRT) - Plan: estradiol (ESTRACE) 1 MG tablet  Vaginal irritation--RT labia minora; most likely due to pad use. Try OTC hydrocortisone crm ext 2-3 times daily for a few days. If sx persist, will try lotrisone crm prn. Keep dry, minimize pads if possible or keep them dry  Vitamin D deficiency - Plan: Vitamin D (25 hydroxy); check labs today.   SUI (stress urinary incontinence, female) - Plan: Ambulatory referral to Physical Therapy; refer to pelvic PT.     Meds ordered this encounter  Medications   estradiol (ESTRACE) 1 MG tablet    Sig: TAKE 1 TABLET BY MOUTH DAILY    Dispense:  90 tablet    Refill:  3    Order Specific Question:   Supervising Provider    Answer:   Renaldo Reel            GYN counsel breast self exam, mammography screening, menopause, adequate intake of calcium and vitamin D, diet and exercise    F/U  Return in about 1 year (around 11/14/2022).  Katrina Corredor B. Aaminah Forrester, PA-C 11/13/2021 10:02 AM

## 2021-11-13 ENCOUNTER — Ambulatory Visit (INDEPENDENT_AMBULATORY_CARE_PROVIDER_SITE_OTHER): Payer: Commercial Managed Care - PPO | Admitting: Obstetrics and Gynecology

## 2021-11-13 ENCOUNTER — Encounter: Payer: Self-pay | Admitting: Obstetrics and Gynecology

## 2021-11-13 VITALS — BP 130/90 | Ht 61.0 in | Wt 250.0 lb

## 2021-11-13 DIAGNOSIS — Z780 Asymptomatic menopausal state: Secondary | ICD-10-CM

## 2021-11-13 DIAGNOSIS — Z79890 Hormone replacement therapy (postmenopausal): Secondary | ICD-10-CM

## 2021-11-13 DIAGNOSIS — Z01419 Encounter for gynecological examination (general) (routine) without abnormal findings: Secondary | ICD-10-CM

## 2021-11-13 DIAGNOSIS — Z1231 Encounter for screening mammogram for malignant neoplasm of breast: Secondary | ICD-10-CM

## 2021-11-13 DIAGNOSIS — N393 Stress incontinence (female) (male): Secondary | ICD-10-CM

## 2021-11-13 DIAGNOSIS — N951 Menopausal and female climacteric states: Secondary | ICD-10-CM | POA: Diagnosis not present

## 2021-11-13 DIAGNOSIS — E559 Vitamin D deficiency, unspecified: Secondary | ICD-10-CM

## 2021-11-13 DIAGNOSIS — N898 Other specified noninflammatory disorders of vagina: Secondary | ICD-10-CM

## 2021-11-13 MED ORDER — ESTRADIOL 1 MG PO TABS: ORAL_TABLET | ORAL | 3 refills | Status: DC

## 2021-11-13 NOTE — Patient Instructions (Addendum)
I value your feedback and you entrusting us with your care. If you get a [facility] patient survey, I would appreciate you taking the time to let us know about your experience today. Thank you!  [facility]: [phone]      

## 2021-11-14 LAB — VITAMIN D 25 HYDROXY (VIT D DEFICIENCY, FRACTURES): Vit D, 25-Hydroxy: 35.9 ng/mL (ref 30.0–100.0)

## 2021-11-19 ENCOUNTER — Ambulatory Visit: Payer: Commercial Managed Care - PPO | Attending: Obstetrics and Gynecology

## 2021-11-19 DIAGNOSIS — R278 Other lack of coordination: Secondary | ICD-10-CM | POA: Insufficient documentation

## 2021-11-19 DIAGNOSIS — N393 Stress incontinence (female) (male): Secondary | ICD-10-CM | POA: Diagnosis not present

## 2021-11-19 DIAGNOSIS — M6289 Other specified disorders of muscle: Secondary | ICD-10-CM | POA: Diagnosis present

## 2021-11-19 DIAGNOSIS — M6281 Muscle weakness (generalized): Secondary | ICD-10-CM | POA: Diagnosis present

## 2021-11-19 NOTE — Therapy (Signed)
OUTPATIENT PHYSICAL THERAPY FEMALE PELVIC EVALUATION   Patient Name: Katrina Munoz MRN: [id number] DOB:[birth year], 56 y.o., female Today's Date: 11/19/2021   PT End of Session - 11/19/21 0930     Visit Number 1    Number of Visits 10    Date for PT Re-Evaluation 01/28/22    Authorization Type IE: 11/19/21    PT Start Time 0930    PT Stop Time 1010    PT Time Calculation (min) 40 min    Activity Tolerance Patient tolerated treatment well             Past Medical History:  Diagnosis Date   Anxiety and depression    BRCA negative 10/2017   MyRisk neg except CDKN2A VUS; IBIS=9.3%   Family history of ovarian cancer    MyRisk neg   GERD (gastroesophageal reflux disease)    H/O nutritional disorder 08/01/2014   Headache, migraine 04/06/2014   Hemorrhoids    IBS (irritable bowel syndrome)    Leiomyoma of uterus    Lumbar radiculopathy 06/17/2014   Overview:  S/p bilateral lumbar decompression L4-5 and L5-S1 s/p excision of large central/left disc herniation 09/16/08 with resolution of symptoms recurrent in 2016 with dx of L5-S1 radiculopathy per Dr. Elmer Sow  [place], [facility]. Report scanned.    Menorrhagia    Stomach ulcer    Past Surgical History:  Procedure Laterality Date   ABDOMINAL HYSTERECTOMY     BACK SURGERY  2010   CESAREAN SECTION  2002   CHOLECYSTECTOMY  2010   COLONOSCOPY     ECTOPIC PREGNANCY SURGERY  2001, 2004   OOPHORECTOMY     TOTAL LAPAROSCOPIC HYSTERECTOMY WITH SALPINGECTOMY  2014   TUBAL LIGATION  2004   Patient Active Problem List   Diagnosis Date Noted   Anxiety 12/11/2020   Nausea and vomiting in adult 01/03/2020   Right knee pain 12/13/2019   Poison ivy 11/02/2018   Peroneal tendinitis 03/02/2018   Acute left ankle pain 02/03/2018   Stable angina pectoris ([place]) 08/26/2016   Gastroesophageal reflux disease without esophagitis 08/01/2016   Hyperglycemia 08/01/2016   Family history of ovarian cancer 07/10/2016   Incontinence 09/11/2015   SUI  (stress urinary incontinence, female) 09/11/2015   S/P hysterectomy 08/02/2015   H/O nutritional disorder 08/01/2014   Chronic lumbar radiculopathy 06/17/2014   Chronic migraine 04/06/2014    PCP: Mychal Beckie Busing, PA-C  REFERRING PROVIDER: Chad Cordial, PA-C   REFERRING DIAG: N39.3 (ICD-10-CM) - SUI (stress urinary incontinence, female)   THERAPY DIAG:  Pelvic floor dysfunction  Other lack of coordination  Muscle weakness (generalized)  Rationale for Evaluation and Treatment: Rehabilitation  ONSET DATE: A few years   RED FLAGS: N/A  Have you had any night sweats? Unexplained weight loss? Saddle anesthesia?  Unexplained changes in bowel or bladder habits?   SUBJECTIVE: Patient confirms identification and approves PT to assess pelvic floor and treatment Yes  PRECAUTIONS: None  WEIGHT BEARING RESTRICTIONS: No  FALLS:  Has patient fallen in last 6 months? No  OCCUPATION/SOCIAL ACTIVITIES: Caregiver, substitute teacher, running errands   PLOF: Independent    LIVING ENVIRONMENT: Lives with: lives with their family and lives with their spouse Lives in: House/apartment   CHIEF CONCERN: Pt reports she feels her weight is contributing some but she has had problems controlling her bladder leading to urinary leakage. Pt has been taking Gentessa for OAB and was working well but insurance does not cover. Pt is now taking Oxybutin. Pt does not want to have surgery because she is the caregiver in her family. Pt has to wear incontinence briefs when she is sick (vomiting) or has increased coughing spells due to newly dx asthma.     PAIN:  Are you having pain? No    PATIENT GOALS: Pt would like not to wear pads all the time or decrease the Lvl, Pt would like to be able to  laugh/cough/sneeze without leakage     UROLOGICAL HISTORY Fluid intake: water, coffee   Pain with urination: No Fully empty bladder: No, some dribbling  Toileting posture: heels raised Stream: Strong Urgency: No Frequency: 7x Nocturia: 1x Leakage: Urge to void, Coughing, Sneezing, Lifting, Bending forward, and Intercourse Pads: Yes Type: Lvl 5 pads Poise Amount: 3x/day, wearing at night  Bladder control (0-10): 4/10   GASTROINTESTINAL HISTORY Pain with bowel movement: No Type of bowel movement:Type (Bristol Stool Scale) 3 and Strain Yes Frequency: 1x/day Fully empty rectum: Yes Leakage: No   SEXUAL HISTORY/FUNCTION Pain with intercourse: During Penetration and Pain Interrupts Intercourse Ability to have vaginal penetration:  Yes; Deep thrusting: Yes Able to achieve orgasm?: Yes  OBSTETRICAL HISTORY Vaginal deliveries: G5P3 Tearing: tearing with one  C-section deliveries: one  Currently pregnant: No  GYNECOLOGICAL HISTORY Hysterectomy: yes, abdominal  Pelvic Organ Prolapse: None Pain with exam: no Heaviness/pressure: no    OBJECTIVE:    COGNITION: Overall cognitive status: Within functional limits for tasks assessed     POSTURE:  Lumbar lordosis:  Deferred 2/2 time constraints  Thoracic kyphosis: Iliac crest height:  Lumbar lateral shift:  Pelvic obliquity:  Leg length discrepancy:   GAIT: Deferred 2/2 time constraints  Distance walked:  Comments:   Trendelenburg:   SENSATION: Deferred 2/2 time constraints  Light touch: , L2-S2 dermatomes  Proprioception:    RANGE OF MOTION:  Deferred 2/2 time constraints   (Norm range in degrees)  LEFT  RIGHT   Lumbar forward flexion (65):      Lumbar extension (30):     Lumbar lateral flexion (25):     Thoracic and Lumbar rotation (30 degrees):       Hip Flexion (0-125):      Hip IR (0-45):     Hip ER (0-45):     Hip Adduction:      Hip Abduction (0-40):     Hip extension (0-15):     (*= pain, Blank  rows = not tested)   STRENGTH: MMT  Deferred 2/2 time constraints   RLE  LLE   Hip Flexion    Hip Extension    Hip Abduction     Hip Adduction     Hip ER     Hip IR     Knee Extension    Knee Flexion    Dorsiflexion     Plantarflexion (seated)    (*= pain, Blank rows = not tested)   SPECIAL TESTS: Deferred 2/2 time constraints  Centralization  and Peripheralization (SN 92, -LR 0.12):  Slump (SN 83, -LR 0.32):  SLR (SN 92, -LR 0.29): R: Lumbar quadrant (SN 70): R:  FABER (SN 81): FADIR (SN 94):  Hip scour (SN 50):  Thigh Thrust (SN 88, -LR 0.18) : Distraction (BP10):  Compression (SN/SP 69): Stork/March (SP 93):   PALPATION: Deferred 2/2 time constraints  Abdominal:  Diastasis:  finger above umbilicus,  fingers at and below umbilicus  Scar mobility: present/mobile perpendicular, parallel Rib flare: present/absent  EXTERNAL PELVIC EXAM: Patient educated on the purpose of the pelvic exam and articulated understanding; patient consented to the exam verbally. Deferred 2/2 time constraints  Palpation: Breath coordination: present/absent/inconsistent Voluntary Contraction: present/absent Relaxation: full/delayed/non-relaxing Perineal movement with sustained IAP increase ("bear down"): descent/no change/elevation/excessive descent Perineal movement with rapid IAP increase ("cough"): elevation/no change/descent Pubic symphysis: (0= no contraction, 1= flicker, 2= weak squeeze, 3= fair squeeze with lift, 4= good squeeze and lift against resistance, 5= strong squeeze against strong resistance)   INTERNAL PELVIC EXAM: Patient educated on the purpose of the pelvic exam and articulated understanding; patient consented to the exam verbally. Deferred 2/2 to time constraints Introitus Appears:  Skin integrity:  Scar mobility: Strength (PERF):  Symmetry: Palpation: Prolapse: (0= no contraction, 1= flicker, 2= weak squeeze, 3= fair squeeze with lift, 4= good squeeze and lift  against resistance, 5= strong squeeze against strong resistance)     Patient Education:  Patient educated on what to expect during course of physical therapy, POC, and provided with HEP including: toileting posture handout. Patient verbalized understanding and returned demonstration. Patient will benefit from further education in order to maximize compliance and understanding for long-term therapeutic gains.   Patient Surveys:  FOTO Urinary Problem - 52    ASSESSMENT:  Clinical Impression: Patient is a 56 y.o. who was seen today for physical therapy evaluation and treatment for a chief concern of urinary leakage.  Today's evaluation suggest deficits in IAP management, PFM strength, PFM extensibility, PFM coordination, posture, pain, and scar mobility as evidenced by hx of c-section/abdominal surgeries, urinary leakage with coughing/sneezing/bending/lifting/intercourse, use of Lvl 5 incontinence pads/incontinence briefs when she is sick, heels raised during toileting, and pain during penetrative sex. Patient's responses on FOTO Urinary Problem (52) indicates moderate limitation/disability/distress. Patient's progress may be limited due to time since onset and demanding nature of being a caregiver; however, patient's motivation is advantageous. Pt with basic understanding of PFM function in bowel/bladder function, posture, sexual function, and the deep core. Patient will benefit from skilled therapeutic intervention to address deficits in IAP management, PFM strength, PFM extensibility, PFM coordination, posture, pain, and scar mobility in order to increase PLOF and improve overall QOL.    Objective Impairments: decreased coordination, decreased endurance, decreased strength, increased fascial restrictions, improper body mechanics, postural dysfunction, and pain.   Activity Limitations: lifting, bending, standing, continence, toileting, locomotion level, and caring for others  Personal Factors:  Age, Behavior pattern, Past/current experiences, Time since onset of injury/illness/exacerbation, and 1-2 comorbidities: asthma and diabetes  are also affecting patient's functional outcome.   Rehab Potential: Good  Clinical Decision Making: Evolving/moderate complexity  Evaluation Complexity: Moderate   GOALS: Goals reviewed with patient? Yes  SHORT TERM GOALS: Target date: 12/24/2021  Patient will demonstrate independence with HEP in order to maximize therapeutic gains and improve carryover from physical therapy sessions to ADLs in the home and community. Baseline: toileting posture handout Goal status: INITIAL    LONG TERM GOALS: Target date: 01/28/2022   Patient will score  >/=  61 on FOTO Urinary Problem in order to demonstrate improved IAP management, improved PFM coordination, and overall QOL.  Baseline: 52 Goal status: INITIAL  2.  Patient will report confidence in ability to control bladder > 7/10 in order to demonstrate improved function and ability to participate more fully in activities at home and in the community. Baseline: 4/10 Goal status: INITIAL  3.  Patient will demonstrate independent and coordinated diaphragmatic breathing in supine with a 1:2 breathing pattern for improved down-regulation of the nervous system and improved management of intra-abdominal pressures in order to increase function at home and in the community. Baseline: will assess next visit  Goal status: INITIAL  4.  Patient will report decreased reliance on protective undergarments as indicated by a 24 hour period to demonstrate improved bladder control and allow for increased participation in activities outside of the home. Baseline: Lvl 5 Poise at least 3x/day/incontinence briefs when sick  Goal status: INITIAL  5.  Patient will report less than 5 incidents of stress urinary incontinence over the course of 3 weeks while coughing/sneezing/laughing/prolonged activity/bending in order to  demonstrate improved PFM coordination, strength, and function for improved overall QOL. Baseline: urinary leakage with all the above Goal status: INITIAL   PLAN: PT Frequency: 1x/week  PT Duration: 10 weeks  Planned Interventions: Therapeutic exercises, Therapeutic activity, Neuromuscular re-education, Balance training, Gait training, Patient/Family education, Self Care, Joint mobilization, Spinal mobilization, Cryotherapy, Moist heat, scar mobilization, Taping, and Manual therapy  Plan For Next Session: phy asses   Nichole Montour, PT, DPT  11/19/2021, 12:59 PM

## 2021-11-27 ENCOUNTER — Ambulatory Visit: Payer: Commercial Managed Care - PPO | Attending: Obstetrics and Gynecology

## 2021-11-27 DIAGNOSIS — M6281 Muscle weakness (generalized): Secondary | ICD-10-CM | POA: Insufficient documentation

## 2021-11-27 DIAGNOSIS — R278 Other lack of coordination: Secondary | ICD-10-CM | POA: Insufficient documentation

## 2021-11-27 DIAGNOSIS — M6289 Other specified disorders of muscle: Secondary | ICD-10-CM | POA: Diagnosis present

## 2021-11-27 NOTE — Therapy (Signed)
OUTPATIENT PHYSICAL THERAPY FEMALE PELVIC TREATMENT   Patient Name: Katrina Munoz MRN: [id number] DOB:1965/09/02, 56 y.o., female Today's Date: 11/27/2021   PT End of Session - 11/27/21 1439     Visit Number 2    Number of Visits 10    Date for PT Re-Evaluation 01/28/22    Authorization Type IE: 11/19/21    PT Start Time 1445    PT Stop Time 1525    PT Time Calculation (min) 40 min    Activity Tolerance Patient tolerated treatment well             Past Medical History:  Diagnosis Date   Anxiety and depression    BRCA negative 10/2017   MyRisk neg except CDKN2A VUS; IBIS=9.3%   Family history of ovarian cancer    MyRisk neg   GERD (gastroesophageal reflux disease)    H/O nutritional disorder 08/01/2014   Headache, migraine 04/06/2014   Hemorrhoids    IBS (irritable bowel syndrome)    Leiomyoma of uterus    Lumbar radiculopathy 06/17/2014   Overview:  S/p bilateral lumbar decompression L4-5 and L5-S1 s/p excision of large central/left disc herniation 09/16/08 with resolution of symptoms recurrent in 2016 with dx of L5-S1 radiculopathy per Dr. Elmer Sow  [place], [facility]. Report scanned.    Menorrhagia    Stomach ulcer    Past Surgical History:  Procedure Laterality Date   ABDOMINAL HYSTERECTOMY     BACK SURGERY  2010   CESAREAN SECTION  2002   CHOLECYSTECTOMY  2010   COLONOSCOPY     ECTOPIC PREGNANCY SURGERY  2001, 2004   OOPHORECTOMY     TOTAL LAPAROSCOPIC HYSTERECTOMY WITH SALPINGECTOMY  2014   TUBAL LIGATION  2004   Patient Active Problem List   Diagnosis Date Noted   Anxiety 12/11/2020   Nausea and vomiting in adult 01/03/2020   Right knee pain 12/13/2019   Poison ivy 11/02/2018   Peroneal tendinitis 03/02/2018   Acute left ankle pain 02/03/2018   Stable angina pectoris ([place]) 08/26/2016   Gastroesophageal reflux disease without esophagitis 08/01/2016   Hyperglycemia 08/01/2016   Family history of ovarian cancer 07/10/2016   Incontinence 09/11/2015   SUI  (stress urinary incontinence, female) 09/11/2015   S/P hysterectomy 08/02/2015   H/O nutritional disorder 08/01/2014   Chronic lumbar radiculopathy 06/17/2014   Chronic migraine 04/06/2014    PCP: Mychal Beckie Busing, PA-C  REFERRING PROVIDER: Chad Cordial, PA-C   REFERRING DIAG: N39.3 (ICD-10-CM) - SUI (stress urinary incontinence, female)   THERAPY DIAG:  Pelvic floor dysfunction  Other lack of coordination  Muscle weakness (generalized)  Rationale for Evaluation and Treatment: Rehabilitation  ONSET DATE: A few years  PRECAUTIONS: None  WEIGHT BEARING RESTRICTIONS: No  FALLS:  Has patient fallen in last 6 months? No  OCCUPATION/SOCIAL ACTIVITIES: Caregiver, substitute teacher, running errands    CHIEF CONCERN: Pt reports she feels her weight is contributing some but she has had problems controlling her bladder leading to urinary leakage. Pt has been taking Gentessa for OAB and was working well but insurance does not cover. Pt is now taking Oxybutin. Pt does not want to have surgery because she is the caregiver in her family. Pt has to wear incontinence briefs when she is sick (vomiting) or has increased coughing spells due to newly dx asthma.     PATIENT GOALS: Pt would like not to wear pads all the time or decrease the Lvl, Pt would like to be able to laugh/cough/sneeze without leakage    UROLOGICAL HISTORY Fluid intake: water, coffee   Pain with urination: No Fully empty bladder: No, some dribbling  Toileting posture: heels raised Stream: Strong Urgency: No Frequency: 7x Nocturia: 1x Leakage: Urge to void, Coughing, Sneezing, Lifting, Bending forward, and Intercourse Pads: Yes Type: Lvl 5 pads Poise Amount: 3x/day, wearing at night  Bladder control (0-10): 4/10    GASTROINTESTINAL HISTORY Pain with bowel movement: No Type of bowel movement:Type (Bristol Stool Scale) 3 and Strain Yes Frequency: 1x/day Fully empty rectum: Yes Leakage: No   SEXUAL HISTORY/FUNCTION Pain with intercourse: During Penetration and Pain Interrupts Intercourse Ability to have vaginal penetration:  Yes; Deep thrusting: Yes Able to achieve orgasm?: Yes  OBSTETRICAL HISTORY Vaginal deliveries: G5P3 Tearing: tearing with one  C-section deliveries: one  Currently pregnant: No  GYNECOLOGICAL HISTORY Hysterectomy: yes, abdominal  Pelvic Organ Prolapse: None Pain with exam: no Heaviness/pressure: no     SUBJECTIVE:  Pt has no changes from initial evaluation. Pt has been more aware of her raised heels and she is in that position more than she thought.   PAIN:  Are you having pain? No    TODAY'S TREATMENT   Neuromuscular Re-education  Pre-treatment assessment:   OBJECTIVE:    COGNITION: Overall cognitive status: Within functional limits for tasks assessed     POSTURE:              Iliac crest height: L iliac crest, L shoulder elevated Pelvic obliquity: WNL   RANGE OF MOTION:    (Norm range in degrees)  LEFT 11/27/21 RIGHT 11/27/21  Lumbar forward flexion (65):  WNL    Lumbar extension (30): WNL    Lumbar lateral flexion (25):  WNL Restricted   Thoracic and Lumbar rotation (30 degrees):    WNL WNL  Hip Flexion (0-125):   WNL WNL  Hip IR (0-45):  WNL WNL  Hip ER (0-45):  WNL WNL  Hip Adduction:      Hip Abduction (0-40):  WNL WNL  Hip extension (0-15):     (*= pain, Blank rows = not tested)   STRENGTH: MMT    RLE 11/27/21 LLE 11/27/21  Hip Flexion 4 4  Hip Extension 5 5  Hip Abduction     Hip Adduction     Hip ER  4 4  Hip IR  5 5  Knee Extension 5 4  Knee Flexion 5 5  Dorsiflexion     Plantarflexion (seated) 5 5  (*= pain, Blank rows = not tested)   SPECIAL TESTS:   FABER (SN 81): negative B FADIR (SN 94): negative  B   PALPATION:  Abdominal:  Diastasis: will assess next visit  Scar  mobility: present, c-section - increased scar restriction more towards R>L with increasing "stabbing" sensation  -Center of abdomen and across upper quadrants with increased fascial tension but no discomfort   EXTERNAL PELVIC EXAM: Patient educated on the purpose of the pelvic exam and articulated understanding; patient consented to the exam verbally. Deferred 2/2 time constraints  Palpation: Breath coordination: present/absent/inconsistent Voluntary Contraction: present/absent Relaxation: full/delayed/non-relaxing Perineal movement with sustained IAP increase ("bear down"): descent/no change/elevation/excessive descent Perineal movement with rapid IAP increase ("cough"): elevation/no change/descent Pubic symphysis: (0= no contraction, 1= flicker, 2= weak squeeze, 3= fair squeeze with lift, 4= good squeeze and lift against resistance, 5= strong squeeze against strong resistance)   Manual Therapy: Abdominal myofascial release for improved fascial sling mobility particularly in the center of the abdomen (upper quadrants)  Scar mobilization in circular/vertical/horizontal motions for improved tissue extensibility and pain modulation  "Stabbing" sensation improved upon superficial myofascial technique  Able to penetrate deeper into scar restriction with increased time, no increased pain    Neuromuscular Re-education: Discussion and demonstration on log roll technique for improved IAP management and to decrease lower back pain Pt had urinary leakage with sitting>lying supine   Discussion on how scar restriction can increase pain with penetrative sex (initially) due to taut tissue at c-section scar and perineal scars from tears. Pt verbalized understanding.    Patient response to interventions: Pt felt her abdomen was less "tense"   Patient Education:  Patient provided with HEP including: c-section scar massage  handout. Patient verbalized understanding and returned demonstration. Patient will benefit from further education in order to maximize compliance and understanding for long-term therapeutic gains.     ASSESSMENT:  Clinical Impression: Patient presents to clinic with excellent motivation to participate in today's session. Upon physical examination, Pt demonstrates deficits in IAP management, PFM coordination, LE strength, posture, pain, and scar mobility as evidenced by increased L iliac crest height, increased L shoulder elevation, restricted R lateral flexion ROM, 4/5 MMT with B hip flexion/hip ER and L knee ext, increased fascial tension across upper quadrants of abdomen (no discomfort), scar restriction at c-section which produced a "stabbing" sensation, and urinary leakage with transitional movement (sitting>supine). Will assess PFM externally next session. Upon manual technique, Pt initially with sharp/stabbing pain at c-section scar upon superficial palpation more on R>L; however, with increased time, Pt with no pain. At end of session, Pt reports feeling less "tense" in the abdominal region. Pt responded positively to manual, active, and educational interventions. Patient will continue to benefit from skilled therapeutic intervention to address deficits in IAP management, PFM coordination, LE strength, posture, pain, and scar mobility in order to increase PLOF and improve overall QOL.    Objective Impairments: decreased coordination, decreased endurance, decreased strength, increased fascial restrictions, improper body mechanics, postural dysfunction, and pain.   Activity Limitations: lifting, bending, standing, continence, toileting, locomotion level, and caring for others  Personal Factors: Age, Behavior pattern, Past/current experiences, Time since onset of injury/illness/exacerbation, and 1-2 comorbidities: asthma and diabetes  are also affecting patient's functional outcome.   Rehab  Potential: Good  Clinical Decision Making: Evolving/moderate complexity  Evaluation Complexity: Moderate   GOALS: Goals reviewed with patient? Yes  SHORT TERM GOALS: Target date: 12/24/2021  Patient will demonstrate independence with HEP in order to maximize therapeutic gains and improve carryover from physical therapy sessions to ADLs in the home and community. Baseline: toileting posture handout Goal status: INITIAL    LONG TERM GOALS: Target date: 01/28/2022   Patient will score  >/=  61 on FOTO Urinary Problem in order to demonstrate improved IAP management, improved PFM coordination, and overall QOL.  Baseline: 52 Goal status: INITIAL  2.  Patient will report confidence in ability to control bladder > 7/10 in order to demonstrate improved function and ability to participate more fully in activities at home and in the community. Baseline: 4/10 Goal status: INITIAL  3.  Patient will demonstrate independent and coordinated diaphragmatic breathing in supine with a 1:2 breathing pattern for improved down-regulation of the nervous system and improved management of intra-abdominal pressures in order to increase function at home and in the community. Baseline: will assess next visit  Goal status: INITIAL  4.  Patient will report decreased reliance on protective undergarments as indicated by a 24 hour period to demonstrate improved bladder control and allow for increased participation in activities outside of the home. Baseline: Lvl 5 Poise at least 3x/day/incontinence briefs when sick  Goal status: INITIAL  5.  Patient will report less than 5 incidents of stress urinary incontinence over the course of 3 weeks while coughing/sneezing/laughing/prolonged activity/bending in order to demonstrate improved PFM coordination, strength, and function for improved overall QOL. Baseline: urinary leakage with all the above Goal status: INITIAL   PLAN: PT Frequency: 1x/week  PT Duration: 10  weeks  Planned Interventions: Therapeutic exercises, Therapeutic activity, Neuromuscular re-education, Balance training, Gait training, Patient/Family education, Self Care, Joint mobilization, Spinal mobilization, Cryotherapy, Moist heat, scar mobilization, Taping, and Manual therapy  Plan For Next Session: PFM external, thoracic rotation, breath with lying down, how was scar massage?   Israa Caban, PT, DPT  11/27/2021, 2:40 PM

## 2021-12-03 ENCOUNTER — Ambulatory Visit: Payer: Commercial Managed Care - PPO

## 2021-12-03 DIAGNOSIS — M6289 Other specified disorders of muscle: Secondary | ICD-10-CM

## 2021-12-03 DIAGNOSIS — M6281 Muscle weakness (generalized): Secondary | ICD-10-CM

## 2021-12-03 DIAGNOSIS — R278 Other lack of coordination: Secondary | ICD-10-CM

## 2021-12-03 NOTE — Therapy (Signed)
OUTPATIENT PHYSICAL THERAPY FEMALE PELVIC TREATMENT   Patient Name: Katrina Munoz MRN: [id number] DOB:15-Dec-1965, 56 y.o., female Today's Date: 12/03/2021   PT End of Session - 12/03/21 1102     Visit Number 3    Number of Visits 10    Date for PT Re-Evaluation 01/28/22    Authorization Type IE: 11/19/21    PT Start Time 1100    PT Stop Time 1140    PT Time Calculation (min) 40 min    Activity Tolerance Patient tolerated treatment well             Past Medical History:  Diagnosis Date   Anxiety and depression    BRCA negative 10/2017   MyRisk neg except CDKN2A VUS; IBIS=9.3%   Family history of ovarian cancer    MyRisk neg   GERD (gastroesophageal reflux disease)    H/O nutritional disorder 08/01/2014   Headache, migraine 04/06/2014   Hemorrhoids    IBS (irritable bowel syndrome)    Leiomyoma of uterus    Lumbar radiculopathy 06/17/2014   Overview:  S/p bilateral lumbar decompression L4-5 and L5-S1 s/p excision of large central/left disc herniation 09/16/08 with resolution of symptoms recurrent in 2016 with dx of L5-S1 radiculopathy per Dr. Elmer Sow  [place], [facility]. Report scanned.    Menorrhagia    Stomach ulcer    Past Surgical History:  Procedure Laterality Date   ABDOMINAL HYSTERECTOMY     BACK SURGERY  2010   CESAREAN SECTION  2002   CHOLECYSTECTOMY  2010   COLONOSCOPY     ECTOPIC PREGNANCY SURGERY  2001, 2004   OOPHORECTOMY     TOTAL LAPAROSCOPIC HYSTERECTOMY WITH SALPINGECTOMY  2014   TUBAL LIGATION  2004   Patient Active Problem List   Diagnosis Date Noted   Anxiety 12/11/2020   Nausea and vomiting in adult 01/03/2020   Right knee pain 12/13/2019   Poison ivy 11/02/2018   Peroneal tendinitis 03/02/2018   Acute left ankle pain 02/03/2018   Stable angina pectoris ([place]) 08/26/2016   Gastroesophageal reflux disease without esophagitis 08/01/2016   Hyperglycemia 08/01/2016   Family history of ovarian cancer 07/10/2016   Incontinence 09/11/2015   SUI  (stress urinary incontinence, female) 09/11/2015   S/P hysterectomy 08/02/2015   H/O nutritional disorder 08/01/2014   Chronic lumbar radiculopathy 06/17/2014   Chronic migraine 04/06/2014    PCP: Mychal Beckie Busing, PA-C  REFERRING PROVIDER: Chad Cordial, PA-C   REFERRING DIAG: N39.3 (ICD-10-CM) - SUI (stress urinary incontinence, female)   THERAPY DIAG:  Pelvic floor dysfunction  Other lack of coordination  Muscle weakness (generalized)  Rationale for Evaluation and Treatment: Rehabilitation  ONSET DATE: A few years  PRECAUTIONS: None  WEIGHT BEARING RESTRICTIONS: No  FALLS:  Has patient fallen in last 6 months? No  OCCUPATION/SOCIAL ACTIVITIES: Caregiver, substitute teacher, running errands    CHIEF CONCERN: Pt reports she feels her weight is contributing some but she has had problems controlling her bladder leading to urinary leakage. Pt has been taking Gentessa for OAB and was working well but insurance does not cover. Pt is now taking Oxybutin. Pt does not want to have surgery because she is the caregiver in her family. Pt has to wear incontinence briefs when she is sick (vomiting) or has increased coughing spells due to newly dx asthma.     PATIENT GOALS: Pt would like not to wear pads all the time or decrease the Lvl, Pt would like to be able to laugh/cough/sneeze without leakage    UROLOGICAL HISTORY Fluid intake: water, coffee   Pain with urination: No Fully empty bladder: No, some dribbling  Toileting posture: heels raised Stream: Strong Urgency: No Frequency: 7x Nocturia: 1x Leakage: Urge to void, Coughing, Sneezing, Lifting, Bending forward, and Intercourse Pads: Yes Type: Lvl 5 pads Poise Amount: 3x/day, wearing at night  Bladder control (0-10): 4/10    GASTROINTESTINAL HISTORY Pain with bowel movement: No Type of bowel movement:Type (Bristol Stool Scale) 3 and Strain Yes Frequency: 1x/day Fully empty rectum: Yes Leakage: No   SEXUAL HISTORY/FUNCTION Pain with intercourse: During Penetration and Pain Interrupts Intercourse Ability to have vaginal penetration:  Yes; Deep thrusting: Yes Able to achieve orgasm?: Yes  OBSTETRICAL HISTORY Vaginal deliveries: G5P3 Tearing: tearing with one  C-section deliveries: one  Currently pregnant: No  GYNECOLOGICAL HISTORY Hysterectomy: yes, abdominal  Pelvic Organ Prolapse: None Pain with exam: no Heaviness/pressure: no     SUBJECTIVE:  Pt has tried scar massage this past week at least twice, and Pt had to stop because there was increased pain "needle like" and felt sick.   PAIN:  Are you having pain? No     OBJECTIVE:    COGNITION: Overall cognitive status: Within functional limits for tasks assessed     POSTURE:              Iliac crest height: L iliac crest, L shoulder elevated Pelvic obliquity: WNL   RANGE OF MOTION:    (Norm range in degrees)  LEFT 11/27/21 RIGHT 11/27/21  Lumbar forward flexion (65):  WNL    Lumbar extension (30): WNL    Lumbar lateral flexion (25):  WNL Restricted   Thoracic and Lumbar rotation (30 degrees):    WNL WNL  Hip Flexion (0-125):   WNL WNL  Hip IR (0-45):  WNL WNL  Hip ER (0-45):  WNL WNL  Hip Adduction:      Hip Abduction (0-40):  WNL WNL  Hip extension (0-15):     (*= pain, Blank rows = not tested)   STRENGTH: MMT    RLE 11/27/21 LLE 11/27/21  Hip Flexion 4 4  Hip Extension 5 5  Hip Abduction     Hip Adduction     Hip ER  4 4  Hip IR  5 5  Knee Extension 5 4  Knee Flexion 5 5  Dorsiflexion     Plantarflexion (seated) 5 5  (*= pain, Blank rows = not tested)   SPECIAL TESTS:   FABER (SN 81): negative B FADIR (SN 94): negative B   PALPATION:  Abdominal:  Diastasis: will assess next visit  Scar mobility: present,  c-section - increased scar restriction more towards R>L  with increasing "stabbing" sensation  -Center of abdomen and across upper quadrants with increased fascial tension but no discomfort    TODAY'S TREATMENT  Manual Therapy: Scar mobilization in circular/vertical/horizontal motions for improved tissue extensibility and pain modulation  No "stabbing" sensation felt upon superficial or deep palpation  Continues to present with scar restriction    Neuromuscular Re-education:  EXTERNAL PELVIC EXAM: Patient educated on the purpose of the pelvic exam and articulated understanding; patient consented to the exam verbally.  Breath coordination: present Voluntary Contraction: present, 3/5 MMT with Valsalva noted Relaxation: full Perineal movement with sustained IAP increase ("bear down"): descent Perineal movement with rapid IAP increase ("cough"): no change (0= no contraction, 1= flicker, 2= weak squeeze, 3= fair squeeze with lift, 4= good squeeze and lift against resistance, 5= strong squeeze against strong resistance)  Supine hooklying diaphragmatic breathing with VCs and TCs for downregulation of the nervous system and improved management of IAP  Right sidelying thoracic rotations, x10, for improved lengthening of the anterior fascial slings  Pt felt slight pull at front of upper abdomen but eased with increased time   Patient response to interventions: Pt felt better during c-section massage this session compared to last   Patient Education:  Patient provided with HEP including: supine diaphragmatic breathing and sidelying thoracic rotation. Patient verbalized understanding and returned demonstration. Patient will benefit from further education in order to maximize compliance and understanding for long-term therapeutic gains.     ASSESSMENT:  Clinical Impression: Patient presents to clinic with excellent motivation to participate in today's session. Upon physical external  examination of PFM, Pt demonstrates deficits in PFM strength and PFM coordination as evidenced by 3/5 MMT PFM with Valsalva maneuver noted and slight urinary leakage with rapid IAP increase (coughing). Pt continues to demonstrate deficits in IAP management,  LE strength, posture, pain, and scar mobility. Upon manual technique, Pt able to tolerate superficial and deeper mobilization with no report of sharp/stabbing pain. Pt continues to demonstrate increase scar restriction at c-section scar but with no pain. Pt responded positively to manual, active, and educational interventions. Patient will continue to benefit from skilled therapeutic intervention to address deficits in IAP management, PFM coordination, LE strength, posture, pain, and scar mobility in order to increase PLOF and improve overall QOL.    Objective Impairments: decreased coordination, decreased endurance, decreased strength, increased fascial restrictions, improper body mechanics, postural dysfunction, and pain.   Activity Limitations: lifting, bending, standing, continence, toileting, locomotion level, and caring for others  Personal Factors: Age, Behavior pattern, Past/current experiences, Time since onset of injury/illness/exacerbation, and 1-2 comorbidities: asthma and diabetes  are also affecting patient's functional outcome.   Rehab Potential: Good  Clinical Decision Making: Evolving/moderate complexity  Evaluation Complexity: Moderate   GOALS: Goals reviewed with patient? Yes  SHORT TERM GOALS: Target date: 12/24/2021  Patient will demonstrate independence with HEP in order to maximize therapeutic gains and improve carryover from physical therapy sessions to ADLs in the home and community. Baseline: toileting posture handout Goal status: INITIAL    LONG TERM GOALS: Target date: 01/28/2022   Patient will score  >/= 61 on FOTO Urinary Problem in order to demonstrate improved IAP management, improved PFM coordination,  and overall QOL.  Baseline: 52 Goal status: INITIAL  2.  Patient will report confidence in ability to control bladder > 7/10 in order to demonstrate improved function and ability to participate more fully in activities at home and in the community. Baseline: 4/10 Goal status: INITIAL  3.  Patient will demonstrate independent and coordinated diaphragmatic breathing in supine with a 1:2 breathing pattern for improved down-regulation of the nervous system and improved management of intra-abdominal pressures in order to increase function at home and in the community. Baseline: will assess next visit, (9/11): Valsalva maneuver with PFM strength test   Goal status: INITIAL  4.  Patient will report decreased reliance on protective undergarments as indicated by a 24 hour period to demonstrate improved bladder control and allow for increased participation in activities outside of the home. Baseline: Lvl 5 Poise at least 3x/day/incontinence briefs when sick  Goal status: INITIAL  5.  Patient will report less than 5 incidents of stress urinary incontinence over the course of 3 weeks while coughing/sneezing/laughing/prolonged activity/bending in order to demonstrate improved PFM coordination, strength, and function for improved overall QOL. Baseline: urinary leakage with all the above Goal status: INITIAL   PLAN: PT Frequency: 1x/week  PT Duration: 10 weeks  Planned Interventions: Therapeutic exercises, Therapeutic activity, Neuromuscular re-education, Balance training, Gait training, Patient/Family education, Self Care, Joint mobilization, Spinal mobilization, Cryotherapy, Moist heat, scar mobilization, Taping, and Manual therapy  Plan For Next Session: how was scar massage this week? Print out moisturizer, start deep core   Leigh Blas, PT, DPT  12/03/2021, 11:03 AM

## 2021-12-06 ENCOUNTER — Ambulatory Visit
Admission: RE | Admit: 2021-12-06 | Discharge: 2021-12-06 | Disposition: A | Discharge status: Home / Self Care | Payer: Commercial Managed Care - PPO | Source: Ambulatory Visit | Attending: Obstetrics and Gynecology | Admitting: Obstetrics and Gynecology

## 2021-12-06 DIAGNOSIS — Z1231 Encounter for screening mammogram for malignant neoplasm of breast: Secondary | ICD-10-CM | POA: Diagnosis present

## 2021-12-07 ENCOUNTER — Other Ambulatory Visit: Payer: Self-pay | Admitting: Obstetrics and Gynecology

## 2021-12-07 DIAGNOSIS — N6489 Other specified disorders of breast: Secondary | ICD-10-CM

## 2021-12-07 DIAGNOSIS — R928 Other abnormal and inconclusive findings on diagnostic imaging of breast: Secondary | ICD-10-CM

## 2021-12-08 ENCOUNTER — Encounter: Payer: Self-pay | Admitting: Obstetrics and Gynecology

## 2021-12-12 ENCOUNTER — Ambulatory Visit: Payer: Commercial Managed Care - PPO

## 2021-12-12 DIAGNOSIS — M6289 Other specified disorders of muscle: Secondary | ICD-10-CM

## 2021-12-12 DIAGNOSIS — M6281 Muscle weakness (generalized): Secondary | ICD-10-CM

## 2021-12-12 DIAGNOSIS — R278 Other lack of coordination: Secondary | ICD-10-CM

## 2021-12-12 NOTE — Therapy (Addendum)
OUTPATIENT PHYSICAL THERAPY FEMALE PELVIC TREATMENT   Patient Name: Katrina Munoz MRN: [id number] DOB:[birth year], 56 y.o., female Today's Date: 12/12/2021   PT End of Session - 12/12/21 1110     Visit Number 4    Number of Visits 10    Date for PT Re-Evaluation 01/28/22    Authorization Type IE: 11/19/21    PT Start Time 1110    PT Stop Time 1140    PT Time Calculation (min) 30 min    Activity Tolerance Patient tolerated treatment well             Past Medical History:  Diagnosis Date   Anxiety and depression    BRCA negative 10/2017   MyRisk neg except CDKN2A VUS; IBIS=9.3%   Family history of ovarian cancer    MyRisk neg   GERD (gastroesophageal reflux disease)    H/O nutritional disorder 08/01/2014   Headache, migraine 04/06/2014   Hemorrhoids    IBS (irritable bowel syndrome)    Leiomyoma of uterus    Lumbar radiculopathy 06/17/2014   Overview:  S/p bilateral lumbar decompression L4-5 and L5-S1 s/p excision of large central/left disc herniation 09/16/08 with resolution of symptoms recurrent in 2016 with dx of L5-S1 radiculopathy per Dr. Elmer Sow  [place], [facility]. Report scanned.    Menorrhagia    Stomach ulcer    Past Surgical History:  Procedure Laterality Date   ABDOMINAL HYSTERECTOMY     BACK SURGERY  2010   CESAREAN SECTION  2002   CHOLECYSTECTOMY  2010   COLONOSCOPY     ECTOPIC PREGNANCY SURGERY  2001, 2004   OOPHORECTOMY     TOTAL LAPAROSCOPIC HYSTERECTOMY WITH SALPINGECTOMY  2014   TUBAL LIGATION  2004   Patient Active Problem List   Diagnosis Date Noted   Anxiety 12/11/2020   Nausea and vomiting in adult 01/03/2020   Right knee pain 12/13/2019   Poison ivy 11/02/2018   Peroneal tendinitis 03/02/2018   Acute left ankle pain 02/03/2018   Stable angina pectoris ([place]) 08/26/2016   Gastroesophageal reflux disease without esophagitis 08/01/2016   Hyperglycemia 08/01/2016   Family history of ovarian cancer 07/10/2016   Incontinence 09/11/2015   SUI  (stress urinary incontinence, female) 09/11/2015   S/P hysterectomy 08/02/2015   H/O nutritional disorder 08/01/2014   Chronic lumbar radiculopathy 06/17/2014   Chronic migraine 04/06/2014    PCP: Mychal Beckie Busing, PA-C  REFERRING PROVIDER: Chad Cordial, PA-C   REFERRING DIAG: N39.3 (ICD-10-CM) - SUI (stress urinary incontinence, female)   THERAPY DIAG:  Pelvic floor dysfunction  Muscle weakness (generalized)  Other lack of coordination  Rationale for Evaluation and Treatment: Rehabilitation  ONSET DATE: A few years  PRECAUTIONS: None  WEIGHT BEARING RESTRICTIONS: No  FALLS:  Has patient fallen in last 6 months? No  OCCUPATION/SOCIAL ACTIVITIES: Caregiver, substitute teacher, running errands    CHIEF CONCERN: Pt reports she feels her weight is contributing some but she has had problems controlling her bladder leading to urinary leakage. Pt has been taking Gentessa for OAB and was working well but insurance does not cover. Pt is now taking Oxybutin. Pt does not want to have surgery because she is the caregiver in her family. Pt has to wear incontinence briefs when she is sick (vomiting) or has increased coughing spells due to newly dx asthma.     PATIENT GOALS: Pt would like not to wear pads all the time or decrease the Lvl, Pt would like to be able to laugh/cough/sneeze without leakage    UROLOGICAL HISTORY Fluid intake: water, coffee   Pain with urination: No Fully empty bladder: No, some dribbling  Toileting posture: heels raised Stream: Strong Urgency: No Frequency: 7x Nocturia: 1x Leakage: Urge to void, Coughing, Sneezing, Lifting, Bending forward, and Intercourse Pads: Yes Type: Lvl 5 pads Poise Amount: 3x/day, wearing at night  Bladder control (0-10): 4/10    GASTROINTESTINAL HISTORY Pain with bowel movement: No Type of bowel movement:Type (Bristol Stool Scale) 3 and Strain Yes Frequency: 1x/day Fully empty rectum: Yes Leakage: No   SEXUAL HISTORY/FUNCTION Pain with intercourse: During Penetration and Pain Interrupts Intercourse Ability to have vaginal penetration:  Yes; Deep thrusting: Yes Able to achieve orgasm?: Yes  OBSTETRICAL HISTORY Vaginal deliveries: G5P3 Tearing: tearing with one  C-section deliveries: one  Currently pregnant: No  GYNECOLOGICAL HISTORY Hysterectomy: yes, abdominal  Pelvic Organ Prolapse: None Pain with exam: no Heaviness/pressure: no     SUBJECTIVE:  Pt arrived 10 mins after scheduled time. Pt has had some external stressors in terms of her health.   PAIN:  Are you having pain? No     OBJECTIVE:    COGNITION: Overall cognitive status: Within functional limits for tasks assessed     POSTURE:              Iliac crest height: L iliac crest, L shoulder elevated Pelvic obliquity: WNL   RANGE OF MOTION:    (Norm range in degrees)  LEFT 11/27/21 RIGHT 11/27/21  Lumbar forward flexion (65):  WNL    Lumbar extension (30): WNL    Lumbar lateral flexion (25):  WNL Restricted   Thoracic and Lumbar rotation (30 degrees):    WNL WNL  Hip Flexion (0-125):   WNL WNL  Hip IR (0-45):  WNL WNL  Hip ER (0-45):  WNL WNL  Hip Adduction:      Hip Abduction (0-40):  WNL WNL  Hip extension (0-15):     (*= pain, Blank rows = not tested)   STRENGTH: MMT    RLE 11/27/21 LLE 11/27/21  Hip Flexion 4 4  Hip Extension 5 5  Hip Abduction     Hip Adduction     Hip ER  4 4  Hip IR  5 5  Knee Extension 5 4  Knee Flexion 5 5  Dorsiflexion     Plantarflexion (seated) 5 5  (*= pain, Blank rows = not tested)   SPECIAL TESTS:   FABER (SN 81): negative B FADIR (SN 94): negative B   PALPATION:  Abdominal:  Diastasis: will assess next visit  Scar mobility: present, c-section - increased scar restriction  more towards R>L with increasing "stabbing" sensation  -Center of abdomen  and across upper quadrants with increased fascial tension but no discomfort    TODAY'S TREATMENT  Neuromuscular Re-education: Supine hooklying diaphragmatic breathing with VCs and TCs for downregulation of the nervous system and improved management of IAP  Discussion on increased cortisol levels and the sympathetic nervous system as they both impact the pelvic floor. Pt had a few accidents this past week as well in terms of urinary leakage more than normal.   Audio "full body relaxation" video for downregulation of the nervous system and improved management of IAP.   Patient response to interventions: Pt felt less tension in her body at end of session   Patient Education:  Patient provided with HEP including: no change. Patient verbalized understanding and returned demonstration. Patient will benefit from further education in order to maximize compliance and understanding for long-term therapeutic gains.     ASSESSMENT:  Clinical Impression: Patient presents to clinic with excellent motivation to participate in today's session. Pt continues to demonstrate deficits IAP management, PFM coordination, LE strength, posture, pain, and scar mobility. Pt with increased cortisol levels due to new health information and other external stressors. Pt also reports increased urinary leakage and frequency this past week. Lengthy discussion on the sympathetic nervous system in relation to PFM. Pt engaged in technique for downregulation of nervous system (audio video) and demonstrated seated diaphragmatic breathing with minimal cueing. Pt responded positively to active and educational interventions. Patient will continue to benefit from skilled therapeutic intervention to address deficits in IAP management, PFM coordination, LE strength, posture, pain, and scar mobility in order to increase PLOF and improve overall QOL.     Objective Impairments: decreased coordination, decreased endurance, decreased strength, increased fascial restrictions, improper body mechanics, postural dysfunction, and pain.   Activity Limitations: lifting, bending, standing, continence, toileting, locomotion level, and caring for others  Personal Factors: Age, Behavior pattern, Past/current experiences, Time since onset of injury/illness/exacerbation, and 1-2 comorbidities: asthma and diabetes  are also affecting patient's functional outcome.   Rehab Potential: Good  Clinical Decision Making: Evolving/moderate complexity  Evaluation Complexity: Moderate   GOALS: Goals reviewed with patient? Yes  SHORT TERM GOALS: Target date: 12/24/2021  Patient will demonstrate independence with HEP in order to maximize therapeutic gains and improve carryover from physical therapy sessions to ADLs in the home and community. Baseline: toileting posture handout Goal status: INITIAL    LONG TERM GOALS: Target date: 01/28/2022   Patient will score  >/= 61 on FOTO Urinary Problem in order to demonstrate improved IAP management, improved PFM coordination, and overall QOL.  Baseline: 52 Goal status: INITIAL  2.  Patient will report confidence in ability to control bladder > 7/10 in order to demonstrate improved function and ability to participate more fully in activities at home and in the community. Baseline: 4/10 Goal status: INITIAL  3.  Patient will demonstrate independent and coordinated diaphragmatic breathing in supine with a 1:2 breathing pattern for improved down-regulation of the nervous system and improved management of intra-abdominal pressures in order to increase function at home and in the community. Baseline: will assess next visit, (9/11): Valsalva maneuver with PFM strength test   Goal status: INITIAL  4.  Patient will report decreased reliance on protective undergarments as indicated by a 24 hour period to demonstrate  improved bladder control and allow for increased participation in activities outside of the home. Baseline: Lvl 5 Poise at least 3x/day/incontinence briefs when sick  Goal status: INITIAL  5.  Patient will report less than 5 incidents  of stress urinary incontinence over the course of 3 weeks while coughing/sneezing/laughing/prolonged activity/bending in order to demonstrate improved PFM coordination, strength, and function for improved overall QOL. Baseline: urinary leakage with all the above Goal status: INITIAL   PLAN: PT Frequency: 1x/week  PT Duration: 10 weeks  Planned Interventions: Therapeutic exercises, Therapeutic activity, Neuromuscular re-education, Balance training, Gait training, Patient/Family education, Self Care, Joint mobilization, Spinal mobilization, Cryotherapy, Moist heat, scar mobilization, Taping, and Manual therapy  Plan For Next Session:  how was stress this week/2 weeks?, PFM lengthen techniques, start deep core   Mandalyn Pasqua, PT, DPT  12/12/2021, 11:12 AM

## 2021-12-25 ENCOUNTER — Ambulatory Visit: Payer: Commercial Managed Care - PPO

## 2021-12-26 ENCOUNTER — Ambulatory Visit
Admission: RE | Admit: 2021-12-26 | Discharge: 2021-12-26 | Disposition: A | Discharge status: Home / Self Care | Payer: Commercial Managed Care - PPO | Source: Ambulatory Visit | Attending: Obstetrics and Gynecology | Admitting: Obstetrics and Gynecology

## 2021-12-26 DIAGNOSIS — N6489 Other specified disorders of breast: Secondary | ICD-10-CM

## 2021-12-26 DIAGNOSIS — R928 Other abnormal and inconclusive findings on diagnostic imaging of breast: Secondary | ICD-10-CM | POA: Insufficient documentation

## 2021-12-27 ENCOUNTER — Ambulatory Visit: Payer: Commercial Managed Care - PPO | Attending: Obstetrics and Gynecology

## 2021-12-27 ENCOUNTER — Encounter: Payer: Self-pay | Admitting: Obstetrics and Gynecology

## 2021-12-27 DIAGNOSIS — M6281 Muscle weakness (generalized): Secondary | ICD-10-CM | POA: Diagnosis present

## 2021-12-27 DIAGNOSIS — M6289 Other specified disorders of muscle: Secondary | ICD-10-CM | POA: Diagnosis not present

## 2021-12-27 DIAGNOSIS — R278 Other lack of coordination: Secondary | ICD-10-CM | POA: Insufficient documentation

## 2021-12-27 NOTE — Therapy (Signed)
OUTPATIENT PHYSICAL THERAPY FEMALE PELVIC TREATMENT   Patient Name: Katrina Munoz MRN: [id number] DOB:07-Jan-1966, 56 y.o., female Today's Date: 12/27/2021   PT End of Session - 12/27/21 1554     Visit Number 5    Number of Visits 10    Date for PT Re-Evaluation 01/28/22    Authorization Type IE: 11/19/21    PT Start Time 1600    PT Stop Time 1640    PT Time Calculation (min) 40 min    Activity Tolerance Patient tolerated treatment well             Past Medical History:  Diagnosis Date   Anxiety and depression    BRCA negative 10/2017   MyRisk neg except CDKN2A VUS; IBIS=9.3%   Family history of ovarian cancer    MyRisk neg   GERD (gastroesophageal reflux disease)    H/O nutritional disorder 08/01/2014   Headache, migraine 04/06/2014   Hemorrhoids    IBS (irritable bowel syndrome)    Leiomyoma of uterus    Lumbar radiculopathy 06/17/2014   Overview:  S/p bilateral lumbar decompression L4-5 and L5-S1 s/p excision of large central/left disc herniation 09/16/08 with resolution of symptoms recurrent in 2016 with dx of L5-S1 radiculopathy per Dr. Elmer Sow  [place], [facility]. Report scanned.    Menorrhagia    Stomach ulcer    Past Surgical History:  Procedure Laterality Date   ABDOMINAL HYSTERECTOMY     BACK SURGERY  2010   CESAREAN SECTION  2002   CHOLECYSTECTOMY  2010   COLONOSCOPY     ECTOPIC PREGNANCY SURGERY  2001, 2004   OOPHORECTOMY     TOTAL LAPAROSCOPIC HYSTERECTOMY WITH SALPINGECTOMY  2014   TUBAL LIGATION  2004   Patient Active Problem List   Diagnosis Date Noted   Anxiety 12/11/2020   Nausea and vomiting in adult 01/03/2020   Right knee pain 12/13/2019   Poison ivy 11/02/2018   Peroneal tendinitis 03/02/2018   Acute left ankle pain 02/03/2018   Stable angina pectoris 08/26/2016   Gastroesophageal reflux disease without esophagitis 08/01/2016   Hyperglycemia 08/01/2016   Family history of ovarian cancer 07/10/2016   Incontinence 09/11/2015   SUI (stress  urinary incontinence, female) 09/11/2015   S/P hysterectomy 08/02/2015   H/O nutritional disorder 08/01/2014   Chronic lumbar radiculopathy 06/17/2014   Chronic migraine 04/06/2014    PCP: Mychal Beckie Busing, PA-C  REFERRING PROVIDER: Chad Cordial, PA-C   REFERRING DIAG: N39.3 (ICD-10-CM) - SUI (stress urinary incontinence, female)   THERAPY DIAG:  Pelvic floor dysfunction  Muscle weakness (generalized)  Other lack of coordination  Rationale for Evaluation and Treatment: Rehabilitation  ONSET DATE: A few years  PRECAUTIONS: None  WEIGHT BEARING RESTRICTIONS: No  FALLS:  Has patient fallen in last 6 months? No  OCCUPATION/SOCIAL ACTIVITIES: Caregiver, substitute teacher, running errands    CHIEF CONCERN: Pt reports she feels her weight is contributing some but she has had problems controlling her bladder leading to urinary leakage. Pt has been taking Gentessa for OAB and was working well but insurance does not cover. Pt is now taking Oxybutin. Pt does not want to have surgery because she is the caregiver in her family. Pt has to wear incontinence briefs when she is sick (vomiting) or has increased coughing spells due to newly dx asthma.     PATIENT GOALS: Pt would like not to wear pads all the time or decrease the Lvl, Pt would like to be able to laugh/cough/sneeze without leakage    UROLOGICAL HISTORY Fluid intake: water, coffee   Pain with urination: No Fully empty bladder: No, some dribbling  Toileting posture: heels raised Stream: Strong Urgency: No Frequency: 7x Nocturia: 1x Leakage: Urge to void, Coughing, Sneezing, Lifting, Bending forward, and Intercourse Pads: Yes Type: Lvl 5 pads Poise Amount: 3x/day, wearing at night  Bladder control (0-10): 4/10   GASTROINTESTINAL  HISTORY Pain with bowel movement: No Type of bowel movement:Type (Bristol Stool Scale) 3 and Strain Yes Frequency: 1x/day Fully empty rectum: Yes Leakage: No   SEXUAL HISTORY/FUNCTION Pain with intercourse: During Penetration and Pain Interrupts Intercourse Ability to have vaginal penetration:  Yes; Deep thrusting: Yes Able to achieve orgasm?: Yes  OBSTETRICAL HISTORY Vaginal deliveries: G5P3 Tearing: tearing with one  C-section deliveries: one  Currently pregnant: No  GYNECOLOGICAL HISTORY Hysterectomy: yes, abdominal  Pelvic Organ Prolapse: None Pain with exam: no Heaviness/pressure: no     SUBJECTIVE:  Pt is doing well. Pt did have some episodes of urinary leakage during vacation where urine came down the legs. Pt had another episode of extreme urinary leakage a few days ago at home.   PAIN:  Are you having pain? No     OBJECTIVE:    COGNITION: Overall cognitive status: Within functional limits for tasks assessed     POSTURE:              Iliac crest height: L iliac crest, L shoulder elevated Pelvic obliquity: WNL   RANGE OF MOTION:    (Norm range in degrees)  LEFT 11/27/21 RIGHT 11/27/21  Lumbar forward flexion (65):  WNL    Lumbar extension (30): WNL    Lumbar lateral flexion (25):  WNL Restricted   Thoracic and Lumbar rotation (30 degrees):    WNL WNL  Hip Flexion (0-125):   WNL WNL  Hip IR (0-45):  WNL WNL  Hip ER (0-45):  WNL WNL  Hip Adduction:      Hip Abduction (0-40):  WNL WNL  Hip extension (0-15):     (*= pain, Blank rows = not tested)   STRENGTH: MMT    RLE 11/27/21 LLE 11/27/21  Hip Flexion 4 4  Hip Extension 5 5  Hip Abduction     Hip Adduction     Hip ER  4 4  Hip IR  5 5  Knee Extension 5 4  Knee Flexion 5 5  Dorsiflexion     Plantarflexion (seated) 5 5  (*= pain, Blank rows = not tested)   SPECIAL TESTS:   FABER (SN 81): negative B FADIR (SN 94): negative B   PALPATION:  Abdominal:  Diastasis: will assess next visit   Scar mobility: present,  c-section - increased scar restriction more towards R>L with increasing "stabbing" sensation  -Center of abdomen and across upper quadrants with increased fascial tension but no discomfort    EXTERNAL PELVIC EXAM: Patient educated on the purpose of the pelvic exam and articulated understanding; patient consented to the exam verbally.  Breath coordination: present Voluntary Contraction: present, 3/5 MMT with Valsalva noted Relaxation: full Perineal movement with sustained IAP increase ("bear down"): descent Perineal movement with rapid IAP increase ("cough"): no change (0= no contraction, 1= flicker, 2= weak squeeze, 3= fair squeeze with lift, 4= good squeeze and lift against resistance, 5= strong squeeze against strong resistance)  TODAY'S TREATMENT  Neuromuscular Re-education: STSs, x10, with coordinated breath for improved IAP management  Seated piriformis pose with coordinated breath for PFM lengthening, VCs and TCs required  Discussion on urinary urgency control and how to begin incorporating distraction techniques to minimize behavorial patterns that have formed over time.   Supine hooklying diaphragmatic breathing with VCs and TCs for downregulation of the nervous system and improved management of IAP  Sit<>supine transitional movement log roll technique with coordinated breathing for improved IAP management   Patient response to interventions: Pt felt urinary leakage when going from sit>lying    Patient Education:  Patient provided with HEP including: seated piriformis pose, STSs with breath, practice log rolling technique. Patient verbalized understanding and returned demonstration. Patient will benefit from further education in order to maximize compliance and understanding for long-term therapeutic gains.     ASSESSMENT:  Clinical Impression: Patient presents to clinic with excellent motivation to participate in today's session. Pt continues  to demonstrate deficits IAP management, PFM coordination, LE strength, posture, pain, and scar mobility. Pt with a few episodes of worsened urinary leakage over vacation. Discussion on breath holding and upregulation of nervous system affecting PFM coordination. Time taken to discuss urinary urgency control and techniques to aid in decreasing urinary frequency. Pt verbalized understanding. Pt required moderate VCs and TCs during active interventions for proper techniques and to decrease Valsalva maneuver. Pt responded positively to active and educational interventions. Patient will continue to benefit from skilled therapeutic intervention to address deficits in IAP management, PFM coordination, LE strength, posture, pain, and scar mobility in order to increase PLOF and improve overall QOL.    Objective Impairments: decreased coordination, decreased endurance, decreased strength, increased fascial restrictions, improper body mechanics, postural dysfunction, and pain.   Activity Limitations: lifting, bending, standing, continence, toileting, locomotion level, and caring for others  Personal Factors: Age, Behavior pattern, Past/current experiences, Time since onset of injury/illness/exacerbation, and 1-2 comorbidities: asthma and diabetes  are also affecting patient's functional outcome.   Rehab Potential: Good  Clinical Decision Making: Evolving/moderate complexity  Evaluation Complexity: Moderate   GOALS: Goals reviewed with patient? Yes  SHORT TERM GOALS: Target date: 12/24/2021  Patient will demonstrate independence with HEP in order to maximize therapeutic gains and improve carryover from physical therapy sessions to ADLs in the home and community. Baseline: toileting posture handout Goal status: INITIAL    LONG TERM GOALS: Target date: 01/28/2022   Patient will score  >/= 61 on FOTO Urinary Problem in order to demonstrate improved IAP management, improved PFM coordination, and overall  QOL.  Baseline: 52 Goal status: INITIAL  2.  Patient will report confidence in ability to control bladder > 7/10 in order to demonstrate improved function and ability to participate more fully in activities at home and in the community. Baseline: 4/10 Goal status: INITIAL  3.  Patient will  demonstrate independent and coordinated diaphragmatic breathing in supine with a 1:2 breathing pattern for improved down-regulation of the nervous system and improved management of intra-abdominal pressures in order to increase function at home and in the community. Baseline: will assess next visit, (9/11): Valsalva maneuver with PFM strength test   Goal status: INITIAL  4.  Patient will report decreased reliance on protective undergarments as indicated by a 24 hour period to demonstrate improved bladder control and allow for increased participation in activities outside of the home. Baseline: Lvl 5 Poise at least 3x/day/incontinence briefs when sick  Goal status: INITIAL  5.  Patient will report less than 5 incidents of stress urinary incontinence over the course of 3 weeks while coughing/sneezing/laughing/prolonged activity/bending in order to demonstrate improved PFM coordination, strength, and function for improved overall QOL. Baseline: urinary leakage with all the above Goal status: INITIAL   PLAN: PT Frequency: 1x/week  PT Duration: 10 weeks  Planned Interventions: Therapeutic exercises, Therapeutic activity, Neuromuscular re-education, Balance training, Gait training, Patient/Family education, Self Care, Joint mobilization, Spinal mobilization, Cryotherapy, Moist heat, scar mobilization, Taping, and Manual therapy  Plan For Next Session:  begin deep core, how did urinary leakage go this week with transitional movements?    Anelly Samarin, PT, DPT  12/27/2021, 3:54 PM

## 2021-12-31 ENCOUNTER — Ambulatory Visit: Payer: Commercial Managed Care - PPO

## 2021-12-31 DIAGNOSIS — M6281 Muscle weakness (generalized): Secondary | ICD-10-CM

## 2021-12-31 DIAGNOSIS — M6289 Other specified disorders of muscle: Secondary | ICD-10-CM | POA: Diagnosis not present

## 2021-12-31 DIAGNOSIS — R278 Other lack of coordination: Secondary | ICD-10-CM

## 2021-12-31 NOTE — Therapy (Signed)
OUTPATIENT PHYSICAL THERAPY FEMALE PELVIC TREATMENT   Patient Name: Katrina Munoz MRN: [id number] DOB:1965/09/20, 56 y.o., female Today's Date: 12/31/2021   PT End of Session - 12/31/21 1100     Visit Number 6    Number of Visits 10    Date for PT Re-Evaluation 01/28/22    Authorization Type IE: 11/19/21    PT Start Time 1100    PT Stop Time 1140    PT Time Calculation (min) 40 min    Activity Tolerance Patient tolerated treatment well             Past Medical History:  Diagnosis Date   Anxiety and depression    BRCA negative 10/2017   MyRisk neg except CDKN2A VUS; IBIS=9.3%   Family history of ovarian cancer    MyRisk neg   GERD (gastroesophageal reflux disease)    H/O nutritional disorder 08/01/2014   Headache, migraine 04/06/2014   Hemorrhoids    IBS (irritable bowel syndrome)    Leiomyoma of uterus    Lumbar radiculopathy 06/17/2014   Overview:  S/p bilateral lumbar decompression L4-5 and L5-S1 s/p excision of large central/left disc herniation 09/16/08 with resolution of symptoms recurrent in 2016 with dx of L5-S1 radiculopathy per Dr. Elmer Sow  [place], [facility]. Report scanned.    Menorrhagia    Stomach ulcer    Past Surgical History:  Procedure Laterality Date   ABDOMINAL HYSTERECTOMY     BACK SURGERY  2010   CESAREAN SECTION  2002   CHOLECYSTECTOMY  2010   COLONOSCOPY     ECTOPIC PREGNANCY SURGERY  2001, 2004   OOPHORECTOMY     TOTAL LAPAROSCOPIC HYSTERECTOMY WITH SALPINGECTOMY  2014   TUBAL LIGATION  2004   Patient Active Problem List   Diagnosis Date Noted   Anxiety 12/11/2020   Nausea and vomiting in adult 01/03/2020   Right knee pain 12/13/2019   Poison ivy 11/02/2018   Peroneal tendinitis 03/02/2018   Acute left ankle pain 02/03/2018   Stable angina pectoris 08/26/2016   Gastroesophageal reflux disease without esophagitis 08/01/2016   Hyperglycemia 08/01/2016   Family history of ovarian cancer 07/10/2016   Incontinence 09/11/2015   SUI (stress  urinary incontinence, female) 09/11/2015   S/P hysterectomy 08/02/2015   H/O nutritional disorder 08/01/2014   Chronic lumbar radiculopathy 06/17/2014   Chronic migraine 04/06/2014    PCP: Mychal Beckie Busing, PA-C  REFERRING PROVIDER: Chad Cordial, PA-C   REFERRING DIAG: N39.3 (ICD-10-CM) - SUI (stress urinary incontinence, female)   THERAPY DIAG:  Pelvic floor dysfunction  Muscle weakness (generalized)  Other lack of coordination  Rationale for Evaluation and Treatment: Rehabilitation  ONSET DATE: A few years  PRECAUTIONS: None  WEIGHT BEARING RESTRICTIONS: No  FALLS:  Has patient fallen in last 6 months? No  OCCUPATION/SOCIAL ACTIVITIES: Caregiver, substitute teacher, running errands    CHIEF CONCERN: Pt reports she feels her weight is contributing some but she has had problems controlling her bladder leading to urinary leakage. Pt has been taking Gentessa for OAB and was working well but insurance does not cover. Pt is now taking Oxybutin. Pt does not want to have surgery because she is the caregiver in her family. Pt has to wear incontinence briefs when she is sick (vomiting) or has increased coughing spells due to newly dx asthma.     PATIENT GOALS: Pt would like not to wear pads all the time or decrease the Lvl, Pt would like to be able to laugh/cough/sneeze without leakage    UROLOGICAL HISTORY Fluid intake: water, coffee   Pain with urination: No Fully empty bladder: No, some dribbling  Toileting posture: heels raised Stream: Strong Urgency: No Frequency: 7x Nocturia: 1x Leakage: Urge to void, Coughing, Sneezing, Lifting, Bending forward, and Intercourse Pads: Yes Type: Lvl 5 pads Poise Amount: 3x/day, wearing at night  Bladder control (0-10): 4/10   GASTROINTESTINAL  HISTORY Pain with bowel movement: No Type of bowel movement:Type (Bristol Stool Scale) 3 and Strain Yes Frequency: 1x/day Fully empty rectum: Yes Leakage: No   SEXUAL HISTORY/FUNCTION Pain with intercourse: During Penetration and Pain Interrupts Intercourse Ability to have vaginal penetration:  Yes; Deep thrusting: Yes Able to achieve orgasm?: Yes  OBSTETRICAL HISTORY Vaginal deliveries: G5P3 Tearing: tearing with one  C-section deliveries: one  Currently pregnant: No  GYNECOLOGICAL HISTORY Hysterectomy: yes, abdominal  Pelvic Organ Prolapse: None Pain with exam: no Heaviness/pressure: no     SUBJECTIVE:  Pt is doing well and has been practicing her transitional movements with her breathing. Still has been a little difficulty to make a routine but is practicing.    PAIN:  Are you having pain? No    OBJECTIVE:    COGNITION: Overall cognitive status: Within functional limits for tasks assessed     POSTURE:              Iliac crest height: L iliac crest, L shoulder elevated Pelvic obliquity: WNL   RANGE OF MOTION:    (Norm range in degrees)  LEFT 11/27/21 RIGHT 11/27/21  Lumbar forward flexion (65):  WNL    Lumbar extension (30): WNL    Lumbar lateral flexion (25):  WNL Restricted   Thoracic and Lumbar rotation (30 degrees):    WNL WNL  Hip Flexion (0-125):   WNL WNL  Hip IR (0-45):  WNL WNL  Hip ER (0-45):  WNL WNL  Hip Adduction:      Hip Abduction (0-40):  WNL WNL  Hip extension (0-15):     (*= pain, Blank rows = not tested)   STRENGTH: MMT    RLE 11/27/21 LLE 11/27/21  Hip Flexion 4 4  Hip Extension 5 5  Hip Abduction     Hip Adduction     Hip ER  4 4  Hip IR  5 5  Knee Extension 5 4  Knee Flexion 5 5  Dorsiflexion     Plantarflexion (seated) 5 5  (*= pain, Blank rows = not tested)   SPECIAL TESTS:   FABER (SN 81): negative B FADIR (SN 94): negative B   PALPATION:  Abdominal:  Diastasis: will assess next visit  Scar mobility: present,  c-section - increased scar restriction more towards  R>L with increasing "stabbing" sensation  -Center of abdomen and across upper quadrants with increased fascial tension but no discomfort    EXTERNAL PELVIC EXAM: Patient educated on the purpose of the pelvic exam and articulated understanding; patient consented to the exam verbally.  Breath coordination: present Voluntary Contraction: present, 3/5 MMT with Valsalva noted Relaxation: full Perineal movement with sustained IAP increase ("bear down"): descent Perineal movement with rapid IAP increase ("cough"): no change (0= no contraction, 1= flicker, 2= weak squeeze, 3= fair squeeze with lift, 4= good squeeze and lift against resistance, 5= strong squeeze against strong resistance)  TODAY'S TREATMENT  Neuromuscular Re-education: POSTURE:              Iliac crest height: equal iliac crest and equal shoulder height (compared to IE: L iliac crest, L shoulder elevated) Pelvic obliquity: WNL  Discussion on standing postural awareness at the lower chain (knee hyperextension/glute activation) for improved proprioception with anterior   Supine hooklying diaphragmatic breathing with VCs and TCs for downregulation of the nervous system and improved management of IAP  [facility] abdominal rehab   Supine hooklying TrA contraction with coordinated exhale   Seated pelvic tilts, x10, for improved postural stability, VCs and TCs required  Seated TrA activation with coordinated exhale, x10, Vcs and Tcs required   Patient response to interventions: Pt felt TrA more posterior in lower lumbar spine in seated    Patient Education:  Patient provided with HEP including: seated/supine TrA activation, seated pelvic tilts. Patient verbalized understanding and returned demonstration. Patient will benefit from further education in order to maximize compliance and understanding for long-term therapeutic gains.     ASSESSMENT:  Clinical  Impression: Patient presents to clinic with excellent motivation to participate in today's session. Pt continues to demonstrate deficits IAP management, PFM coordination, LE strength, posture, pain, and scar mobility. Upon brief postural assessment, Pt demonstrates equal iliac crest height and shoulder height compared to IE (L iliac crest increase with L shoulder elevation). Discussion on postural alignment in standing and sitting (no increased lumbar lordosis). Pt verbalized understanding. Pt required moderate VCs and TCs during TrA activation in supine and seated for proper technique. Pt responded positively to active and educational interventions. Patient will continue to benefit from skilled therapeutic intervention to address deficits in IAP management, PFM coordination, LE strength, posture, pain, and scar mobility in order to increase PLOF and improve overall QOL.    Objective Impairments: decreased coordination, decreased endurance, decreased strength, increased fascial restrictions, improper body mechanics, postural dysfunction, and pain.   Activity Limitations: lifting, bending, standing, continence, toileting, locomotion level, and caring for others  Personal Factors: Age, Behavior pattern, Past/current experiences, Time since onset of injury/illness/exacerbation, and 1-2 comorbidities: asthma and diabetes  are also affecting patient's functional outcome.   Rehab Potential: Good  Clinical Decision Making: Evolving/moderate complexity  Evaluation Complexity: Moderate   GOALS: Goals reviewed with patient? Yes  SHORT TERM GOALS: Target date: 12/24/2021  Patient will demonstrate independence with HEP in order to maximize therapeutic gains and improve carryover from physical therapy sessions to ADLs in the home and community. Baseline: toileting posture handout Goal status: INITIAL    LONG TERM GOALS: Target date: 01/28/2022   Patient will score  >/= 61 on FOTO Urinary Problem in  order to demonstrate improved IAP management, improved PFM coordination, and overall QOL.  Baseline: 52 Goal status: INITIAL  2.  Patient will report confidence in ability to control bladder > 7/10 in order to demonstrate improved function and  ability to participate more fully in activities at home and in the community. Baseline: 4/10 Goal status: INITIAL  3.  Patient will demonstrate independent and coordinated diaphragmatic breathing in supine with a 1:2 breathing pattern for improved down-regulation of the nervous system and improved management of intra-abdominal pressures in order to increase function at home and in the community. Baseline: will assess next visit, (9/11): Valsalva maneuver with PFM strength test   Goal status: INITIAL  4.  Patient will report decreased reliance on protective undergarments as indicated by a 24 hour period to demonstrate improved bladder control and allow for increased participation in activities outside of the home. Baseline: Lvl 5 Poise at least 3x/day/incontinence briefs when sick  Goal status: INITIAL  5.  Patient will report less than 5 incidents of stress urinary incontinence over the course of 3 weeks while coughing/sneezing/laughing/prolonged activity/bending in order to demonstrate improved PFM coordination, strength, and function for improved overall QOL. Baseline: urinary leakage with all the above Goal status: INITIAL   PLAN: PT Frequency: 1x/week  PT Duration: 10 weeks  Planned Interventions: Therapeutic exercises, Therapeutic activity, Neuromuscular re-education, Balance training, Gait training, Patient/Family education, Self Care, Joint mobilization, Spinal mobilization, Cryotherapy, Moist heat, scar mobilization, Taping, and Manual therapy  Plan For Next Session:  toileting posture/breathing/not strain, continue deep core, FOTO?   Angelli Baruch, PT, DPT  12/31/2021, 11:01 AM

## 2022-01-08 ENCOUNTER — Ambulatory Visit: Payer: Commercial Managed Care - PPO

## 2022-01-08 DIAGNOSIS — R278 Other lack of coordination: Secondary | ICD-10-CM

## 2022-01-08 DIAGNOSIS — M6281 Muscle weakness (generalized): Secondary | ICD-10-CM

## 2022-01-08 DIAGNOSIS — M6289 Other specified disorders of muscle: Secondary | ICD-10-CM

## 2022-01-08 NOTE — Therapy (Signed)
OUTPATIENT PHYSICAL THERAPY FEMALE PELVIC TREATMENT   Patient Name: Katrina Munoz MRN: [id number] DOB:[birth year], 56 y.o., female Today's Date: 01/08/2022   PT End of Session - 01/08/22 0844     Visit Number 7    Number of Visits 10    Date for PT Re-Evaluation 01/28/22    Authorization Type IE: 11/19/21    PT Start Time 0845    PT Stop Time 0925    PT Time Calculation (min) 40 min    Activity Tolerance Patient tolerated treatment well             Past Medical History:  Diagnosis Date   Anxiety and depression    BRCA negative 10/2017   MyRisk neg except CDKN2A VUS; IBIS=9.3%   Family history of ovarian cancer    MyRisk neg   GERD (gastroesophageal reflux disease)    H/O nutritional disorder 08/01/2014   Headache, migraine 04/06/2014   Hemorrhoids    IBS (irritable bowel syndrome)    Leiomyoma of uterus    Lumbar radiculopathy 06/17/2014   Overview:  S/p bilateral lumbar decompression L4-5 and L5-S1 s/p excision of large central/left disc herniation 09/16/08 with resolution of symptoms recurrent in 2016 with dx of L5-S1 radiculopathy per Dr. Elmer Sow  [place], [facility]. Report scanned.    Menorrhagia    Stomach ulcer    Past Surgical History:  Procedure Laterality Date   ABDOMINAL HYSTERECTOMY     BACK SURGERY  2010   CESAREAN SECTION  2002   CHOLECYSTECTOMY  2010   COLONOSCOPY     ECTOPIC PREGNANCY SURGERY  2001, 2004   OOPHORECTOMY     TOTAL LAPAROSCOPIC HYSTERECTOMY WITH SALPINGECTOMY  2014   TUBAL LIGATION  2004   Patient Active Problem List   Diagnosis Date Noted   Anxiety 12/11/2020   Nausea and vomiting in adult 01/03/2020   Right knee pain 12/13/2019   Poison ivy 11/02/2018   Peroneal tendinitis 03/02/2018   Acute left ankle pain 02/03/2018   Stable angina pectoris 08/26/2016   Gastroesophageal reflux disease without esophagitis 08/01/2016   Hyperglycemia 08/01/2016   Family history of ovarian cancer 07/10/2016   Incontinence 09/11/2015   SUI  (stress urinary incontinence, female) 09/11/2015   S/P hysterectomy 08/02/2015   H/O nutritional disorder 08/01/2014   Chronic lumbar radiculopathy 06/17/2014   Chronic migraine 04/06/2014    PCP: Mychal Beckie Busing, PA-C  REFERRING PROVIDER: Chad Cordial, PA-C   REFERRING DIAG: N39.3 (ICD-10-CM) - SUI (stress urinary incontinence, female)   THERAPY DIAG:  No diagnosis found.  Rationale for Evaluation and Treatment: Rehabilitation  ONSET DATE: A few years  PRECAUTIONS: None  WEIGHT BEARING RESTRICTIONS: No  FALLS:  Has patient fallen in last 6 months? No  OCCUPATION/SOCIAL ACTIVITIES: Caregiver, substitute teacher, running errands    CHIEF CONCERN: Pt reports she feels her weight is contributing some but she has had problems controlling her bladder leading to urinary leakage. Pt has been taking Gentessa for OAB and was working well but insurance does not cover. Pt is now taking Oxybutin. Pt does not want to have surgery because she is the caregiver in her family. Pt has to wear incontinence briefs when she is sick (vomiting) or has increased coughing spells due to newly dx asthma.     PATIENT GOALS: Pt would like not to wear pads all the time or decrease the Lvl, Pt would like to be able to laugh/cough/sneeze without leakage    UROLOGICAL HISTORY Fluid intake: water, coffee   Pain with urination: No Fully empty bladder: No, some dribbling  Toileting posture: heels raised Stream: Strong Urgency: No Frequency: 7x Nocturia: 1x Leakage: Urge to void, Coughing, Sneezing, Lifting, Bending forward, and Intercourse Pads: Yes Type: Lvl 5 pads Poise Amount: 3x/day, wearing at night  Bladder control (0-10): 4/10   GASTROINTESTINAL HISTORY Pain with bowel movement: No Type of bowel  movement:Type (Bristol Stool Scale) 3 and Strain Yes Frequency: 1x/day Fully empty rectum: Yes Leakage: No   SEXUAL HISTORY/FUNCTION Pain with intercourse: During Penetration and Pain Interrupts Intercourse Ability to have vaginal penetration:  Yes; Deep thrusting: Yes Able to achieve orgasm?: Yes  OBSTETRICAL HISTORY Vaginal deliveries: G5P3 Tearing: tearing with one  C-section deliveries: one  Currently pregnant: No  GYNECOLOGICAL HISTORY Hysterectomy: yes, abdominal  Pelvic Organ Prolapse: None Pain with exam: no Heaviness/pressure: no     SUBJECTIVE:  Pt is doing well and doing HEP.   PAIN:  Are you having pain? No    OBJECTIVE:    COGNITION: Overall cognitive status: Within functional limits for tasks assessed     POSTURE:              Iliac crest height: L iliac crest, L shoulder elevated Pelvic obliquity: WNL   RANGE OF MOTION:    (Norm range in degrees)  LEFT 11/27/21 RIGHT 11/27/21  Lumbar forward flexion (65):  WNL    Lumbar extension (30): WNL    Lumbar lateral flexion (25):  WNL Restricted   Thoracic and Lumbar rotation (30 degrees):    WNL WNL  Hip Flexion (0-125):   WNL WNL  Hip IR (0-45):  WNL WNL  Hip ER (0-45):  WNL WNL  Hip Adduction:      Hip Abduction (0-40):  WNL WNL  Hip extension (0-15):     (*= pain, Blank rows = not tested)   STRENGTH: MMT    RLE 11/27/21 LLE 11/27/21  Hip Flexion 4 4  Hip Extension 5 5  Hip Abduction     Hip Adduction     Hip ER  4 4  Hip IR  5 5  Knee Extension 5 4  Knee Flexion 5 5  Dorsiflexion     Plantarflexion (seated) 5 5  (*= pain, Blank rows = not tested)   SPECIAL TESTS:   FABER (SN 81): negative B FADIR (SN 94): negative B   PALPATION:  Abdominal:  Diastasis: will assess next visit  Scar mobility: present, c-section - increased scar restriction more towards R>L with increasing "stabbing" sensation  -Center of abdomen and across upper quadrants with increased fascial tension but no  discomfort  EXTERNAL PELVIC EXAM: Patient educated on the purpose of the pelvic exam and articulated understanding; patient consented to the exam verbally.  Breath coordination: present Voluntary Contraction: present, 3/5 MMT with Valsalva noted Relaxation: full Perineal movement with sustained IAP increase ("bear down"): descent Perineal movement with rapid IAP increase ("cough"): no change (0= no contraction, 1= flicker, 2= weak squeeze, 3= fair squeeze with lift, 4= good squeeze and lift against resistance, 5= strong squeeze against strong resistance)  TODAY'S TREATMENT  Neuromuscular Re-education: Discussion on toileting posture and using diaphragmatic breathing to decrease incidences of straining to have a BM or complications of a pelvic organ prolapse. Pt also reports on hemorrhoids.   Seated diaphragmatic breathing with VCs and TCs for downregulation of the nervous system and improved management of IAP  Seated TrA activation with marches (LE challenge) and coordinated exhale, VCs and TCs required   Discussion on standing postural awareness at the lower chain (knee hyperextension/glute activation) for improved proprioception and COG more anteriorly   Patient response to interventions: Pt felt more posterior at the lower spine when try to bring LE up more    Patient Education:  Patient provided with HEP including: seated TrA with marches. Patient verbalized understanding and returned demonstration. Patient will benefit from further education in order to maximize compliance and understanding for long-term therapeutic gains.     ASSESSMENT:  Clinical Impression: Patient presents to clinic with excellent motivation to participate in today's session. Pt continues to demonstrate deficits IAP management, PFM coordination, LE strength, posture, pain, and scar mobility. Pt reports having some "bad" days with urinary incontinence but overall 75% better days. Time taken to discuss  toileting posture with diaphragmatic breathing as Pt reports increased straining with a BM and hx of hemorrhoids. Pt required moderate VCs and TCs during progression of TrA activation in seated for proper technique. Pt responded positively to active and educational interventions. Patient will continue to benefit from skilled therapeutic intervention to address deficits in IAP management, PFM coordination, LE strength, posture, pain, and scar mobility in order to increase PLOF and improve overall QOL.    Objective Impairments: decreased coordination, decreased endurance, decreased strength, increased fascial restrictions, improper body mechanics, postural dysfunction, and pain.   Activity Limitations: lifting, bending, standing, continence, toileting, locomotion level, and caring for others  Personal Factors: Age, Behavior pattern, Past/current experiences, Time since onset of injury/illness/exacerbation, and 1-2 comorbidities: asthma and diabetes  are also affecting patient's functional outcome.   Rehab Potential: Good  Clinical Decision Making: Evolving/moderate complexity  Evaluation Complexity: Moderate   GOALS: Goals reviewed with patient? Yes  SHORT TERM GOALS: Target date: 12/24/2021  Patient will demonstrate independence with HEP in order to maximize therapeutic gains and improve carryover from physical therapy sessions to ADLs in the home and community. Baseline: toileting posture handout Goal status: INITIAL    LONG TERM GOALS: Target date: 01/28/2022   Patient will score  >/= 61 on FOTO Urinary Problem in order to demonstrate improved IAP management, improved PFM coordination, and overall QOL.  Baseline: 52 Goal status: INITIAL  2.  Patient will report confidence in ability to control bladder > 7/10 in order to demonstrate improved function and ability to participate more fully in activities at home and in the community. Baseline: 4/10 Goal status: INITIAL  3.  Patient  will demonstrate independent and coordinated diaphragmatic breathing in supine with a 1:2 breathing pattern for improved down-regulation of the nervous system and improved management of intra-abdominal pressures in order to increase function  at home and in the community. Baseline: will assess next visit, (9/11): Valsalva maneuver with PFM strength test   Goal status: INITIAL  4.  Patient will report decreased reliance on protective undergarments as indicated by a 24 hour period to demonstrate improved bladder control and allow for increased participation in activities outside of the home. Baseline: Lvl 5 Poise at least 3x/day/incontinence briefs when sick  Goal status: INITIAL  5.  Patient will report less than 5 incidents of stress urinary incontinence over the course of 3 weeks while coughing/sneezing/laughing/prolonged activity/bending in order to demonstrate improved PFM coordination, strength, and function for improved overall QOL. Baseline: urinary leakage with all the above Goal status: INITIAL   PLAN: PT Frequency: 1x/week  PT Duration: 10 weeks  Planned Interventions: Therapeutic exercises, Therapeutic activity, Neuromuscular re-education, Balance training, Gait training, Patient/Family education, Self Care, Joint mobilization, Spinal mobilization, Cryotherapy, Moist heat, scar mobilization, Taping, and Manual therapy  Plan For Next Session:  FOTO, how did toileting go?, continue deep core (standing/wall squats)    Selda Jalbert, PT, DPT  01/08/2022, 8:45 AM

## 2022-01-16 ENCOUNTER — Ambulatory Visit: Payer: Commercial Managed Care - PPO

## 2022-01-16 DIAGNOSIS — M6289 Other specified disorders of muscle: Secondary | ICD-10-CM | POA: Diagnosis not present

## 2022-01-16 DIAGNOSIS — M6281 Muscle weakness (generalized): Secondary | ICD-10-CM

## 2022-01-16 DIAGNOSIS — R278 Other lack of coordination: Secondary | ICD-10-CM

## 2022-01-16 NOTE — Therapy (Signed)
OUTPATIENT PHYSICAL THERAPY FEMALE PELVIC TREATMENT   Patient Name: Katrina Munoz MRN: [id number] DOB:24-Jan-1966, 56 y.o., female Today's Date: 01/16/2022   PT End of Session - 01/16/22 1017     Visit Number 8    Number of Visits 10    Date for PT Re-Evaluation 01/28/22    Authorization Type IE: 11/19/21    PT Start Time 1015    PT Stop Time 1055    PT Time Calculation (min) 40 min    Activity Tolerance Patient tolerated treatment well             Past Medical History:  Diagnosis Date   Anxiety and depression    BRCA negative 10/2017   MyRisk neg except CDKN2A VUS; IBIS=9.3%   Family history of ovarian cancer    MyRisk neg   GERD (gastroesophageal reflux disease)    H/O nutritional disorder 08/01/2014   Headache, migraine 04/06/2014   Hemorrhoids    IBS (irritable bowel syndrome)    Leiomyoma of uterus    Lumbar radiculopathy 06/17/2014   Overview:  S/p bilateral lumbar decompression L4-5 and L5-S1 s/p excision of large central/left disc herniation 09/16/08 with resolution of symptoms recurrent in 2016 with dx of L5-S1 radiculopathy per Dr. Elmer Sow  [place], [facility]. Report scanned.    Menorrhagia    Stomach ulcer    Past Surgical History:  Procedure Laterality Date   ABDOMINAL HYSTERECTOMY     BACK SURGERY  2010   CESAREAN SECTION  2002   CHOLECYSTECTOMY  2010   COLONOSCOPY     ECTOPIC PREGNANCY SURGERY  2001, 2004   OOPHORECTOMY     TOTAL LAPAROSCOPIC HYSTERECTOMY WITH SALPINGECTOMY  2014   TUBAL LIGATION  2004   Patient Active Problem List   Diagnosis Date Noted   Anxiety 12/11/2020   Nausea and vomiting in adult 01/03/2020   Right knee pain 12/13/2019   Poison ivy 11/02/2018   Peroneal tendinitis 03/02/2018   Acute left ankle pain 02/03/2018   Stable angina pectoris 08/26/2016   Gastroesophageal reflux disease without esophagitis 08/01/2016   Hyperglycemia 08/01/2016   Family history of ovarian cancer 07/10/2016   Incontinence 09/11/2015   SUI  (stress urinary incontinence, female) 09/11/2015   S/P hysterectomy 08/02/2015   H/O nutritional disorder 08/01/2014   Chronic lumbar radiculopathy 06/17/2014   Chronic migraine 04/06/2014    PCP: Mychal Beckie Busing, PA-C  REFERRING PROVIDER: Chad Cordial, PA-C   REFERRING DIAG: N39.3 (ICD-10-CM) - SUI (stress urinary incontinence, female)   THERAPY DIAG:  Pelvic floor dysfunction  Muscle weakness (generalized)  Other lack of coordination  Rationale for Evaluation and Treatment: Rehabilitation  ONSET DATE: A few years  PRECAUTIONS: None  WEIGHT BEARING RESTRICTIONS: No  FALLS:  Has patient fallen in last 6 months? No  OCCUPATION/SOCIAL ACTIVITIES: Caregiver, substitute teacher, running errands    CHIEF CONCERN: Pt reports she feels her weight is contributing some but she has had problems controlling her bladder leading to urinary leakage. Pt has been taking Gentessa for OAB and was working well but insurance does not cover. Pt is now taking Oxybutin. Pt does not want to have surgery because she is the caregiver in her family. Pt has to wear incontinence briefs when she is sick (vomiting) or has increased coughing spells due to newly dx asthma.     PATIENT GOALS: Pt would like not to wear pads all the time or decrease the Lvl, Pt would like to be able to laugh/cough/sneeze without leakage    UROLOGICAL HISTORY Fluid intake: water, coffee   Pain with urination: No Fully empty bladder: No, some dribbling  Toileting posture: heels raised Stream: Strong Urgency: No Frequency: 7x Nocturia: 1x Leakage: Urge to void, Coughing, Sneezing, Lifting, Bending forward, and Intercourse Pads: Yes Type: Lvl 5 pads Poise Amount: 3x/day, wearing at night  Bladder control (0-10): 4/10    GASTROINTESTINAL HISTORY Pain with bowel movement: No Type of bowel movement:Type (Bristol Stool Scale) 3 and Strain Yes Frequency: 1x/day Fully empty rectum: Yes Leakage: No   SEXUAL HISTORY/FUNCTION Pain with intercourse: During Penetration and Pain Interrupts Intercourse Ability to have vaginal penetration:  Yes; Deep thrusting: Yes Able to achieve orgasm?: Yes  OBSTETRICAL HISTORY Vaginal deliveries: G5P3 Tearing: tearing with one  C-section deliveries: one  Currently pregnant: No  GYNECOLOGICAL HISTORY Hysterectomy: yes, abdominal  Pelvic Organ Prolapse: None Pain with exam: no Heaviness/pressure: no     SUBJECTIVE:  Pt is doing well but having some external stressors being a caregiver. Pt has only had one incidence of urinary leakage.   PAIN:  Are you having pain? No    OBJECTIVE:    COGNITION: Overall cognitive status: Within functional limits for tasks assessed     POSTURE:              Iliac crest height: L iliac crest, L shoulder elevated Pelvic obliquity: WNL   RANGE OF MOTION:    (Norm range in degrees)  LEFT 11/27/21 RIGHT 11/27/21  Lumbar forward flexion (65):  WNL    Lumbar extension (30): WNL    Lumbar lateral flexion (25):  WNL Restricted   Thoracic and Lumbar rotation (30 degrees):    WNL WNL  Hip Flexion (0-125):   WNL WNL  Hip IR (0-45):  WNL WNL  Hip ER (0-45):  WNL WNL  Hip Adduction:      Hip Abduction (0-40):  WNL WNL  Hip extension (0-15):     (*= pain, Blank rows = not tested)   STRENGTH: MMT    RLE 11/27/21 LLE 11/27/21  Hip Flexion 4 4  Hip Extension 5 5  Hip Abduction     Hip Adduction     Hip ER  4 4  Hip IR  5 5  Knee Extension 5 4  Knee Flexion 5 5  Dorsiflexion     Plantarflexion (seated) 5 5  (*= pain, Blank rows = not tested)   SPECIAL TESTS:   FABER (SN 81): negative B FADIR (SN 94): negative B   PALPATION:  Abdominal:  Diastasis: will assess next visit  Scar mobility: present, c-section -  increased scar restriction more towards R>L with increasing "stabbing" sensation  -Center  of abdomen and across upper quadrants with increased fascial tension but no discomfort    EXTERNAL PELVIC EXAM: Patient educated on the purpose of the pelvic exam and articulated understanding; patient consented to the exam verbally.  Breath coordination: present Voluntary Contraction: present, 3/5 MMT with Valsalva noted Relaxation: full Perineal movement with sustained IAP increase ("bear down"): descent Perineal movement with rapid IAP increase ("cough"): no change (0= no contraction, 1= flicker, 2= weak squeeze, 3= fair squeeze with lift, 4= good squeeze and lift against resistance, 5= strong squeeze against strong resistance)  TODAY'S TREATMENT  Neuromuscular Re-education: Reassessment of FOTO: Urinary Problem - IE (52), Today: 58 (Goal - 61) Discussion on responses and progression from IE - praised for improvements  Discussion about LTGs - progression of goals: see below   Discussion on how PFPT with continue to address managing IAP and PFM strength/coordination as Pt has made improvements since IE. Pt verbalized understanding.   Discussion on continuing TrA activation and diaphragmatic breathing techniques to improve IAP management.   Patient response to interventions: Pt comfortable to continue with PFPT   Patient Education:  Patient provided with HEP including: no change. Patient verbalized understanding and returned demonstration. Patient will benefit from further education in order to maximize compliance and understanding for long-term therapeutic gains.     ASSESSMENT:  Clinical Impression: Patient presents to clinic with excellent motivation to participate in today's session. Pt continues to demonstrate deficits IAP management, PFM coordination, LE strength, posture, pain, and scar mobility. Pt reports seeing an improvement in urinary leakage on average over the past few  weeks. Based on FOTO responses from IE (52 - 11/13/21), Pt has demonstrated moderate improvement (58). Pt has been able to switch to Lvl 4 incontinence pads and changing them up to 3x/day mainly for hygiene purposes. Pt reports some hesitation with going down a level with the incontinence pads. Overall, Pt is pleased with progress and comfortable to continue to address deficits in IAP management, PFM coordination, LE strength, posture, pain, and scar mobility to increase PLOF and improve overall QOL.    Objective Impairments: decreased coordination, decreased endurance, decreased strength, increased fascial restrictions, improper body mechanics, postural dysfunction, and pain.   Activity Limitations: lifting, bending, standing, continence, toileting, locomotion level, and caring for others  Personal Factors: Age, Behavior pattern, Past/current experiences, Time since onset of injury/illness/exacerbation, and 1-2 comorbidities: asthma and diabetes  are also affecting patient's functional outcome.   Rehab Potential: Good  Clinical Decision Making: Evolving/moderate complexity  Evaluation Complexity: Moderate   GOALS: Goals reviewed with patient? Yes  SHORT TERM GOALS: Target date: 12/24/2021  Patient will demonstrate independence with HEP in order to maximize therapeutic gains and improve carryover from physical therapy sessions to ADLs in the home and community. Baseline: toileting posture handout Goal status: INITIAL    LONG TERM GOALS: Target date: 01/28/2022   Patient will score  >/= 61 on FOTO Urinary Problem in order to demonstrate improved IAP management, improved PFM coordination, and overall QOL.  Baseline: 52; (10/25): 58  Goal status: IN PROGRESS  2.  Patient will report confidence in ability to control bladder > 7/10 in order to demonstrate improved function and ability to participate more fully in activities at home and in the community. Baseline: 4/10 Goal status:  INITIAL  3.  Patient will demonstrate independent and coordinated diaphragmatic breathing in supine with a 1:2 breathing pattern for improved down-regulation of the nervous system and improved management of intra-abdominal pressures in order to increase  function at home and in the community. Baseline: will assess next visit, (9/11): Valsalva maneuver with PFM strength test   Goal status: INITIAL  4.  Patient will report decreased reliance on protective undergarments as indicated by a 24 hour period to demonstrate improved bladder control and allow for increased participation in activities outside of the home. Baseline: Lvl 5 Poise at least 3x/day/incontinence briefs when sick; (10/25): Lvl 4 not all may be soaked/3x but more for hygiene and occasionally wet/has not been sick  Goal status: IN PROGRESS  5.  Patient will report less than 5 incidents of stress urinary incontinence over the course of 3 weeks while coughing/sneezing/laughing/prolonged activity/bending in order to demonstrate improved PFM coordination, strength, and function for improved overall QOL. Baseline: urinary leakage with all the above Goal status: INITIAL   PLAN: PT Frequency: 1x/week  PT Duration: 10 weeks  Planned Interventions: Therapeutic exercises, Therapeutic activity, Neuromuscular re-education, Balance training, Gait training, Patient/Family education, Self Care, Joint mobilization, Spinal mobilization, Cryotherapy, Moist heat, scar mobilization, Taping, and Manual therapy  Plan For Next Session:  continue deep core (standing/wall squats), sitting as well (modifications as needed)    Timoth Schara, PT, DPT  01/16/2022, 10:18 AM

## 2022-01-21 ENCOUNTER — Ambulatory Visit: Payer: Commercial Managed Care - PPO

## 2022-01-21 DIAGNOSIS — M6289 Other specified disorders of muscle: Secondary | ICD-10-CM

## 2022-01-21 DIAGNOSIS — R278 Other lack of coordination: Secondary | ICD-10-CM

## 2022-01-21 DIAGNOSIS — M6281 Muscle weakness (generalized): Secondary | ICD-10-CM

## 2022-01-21 NOTE — Therapy (Signed)
OUTPATIENT PHYSICAL THERAPY FEMALE PELVIC TREATMENT   Patient Name: Katrina Munoz MRN: [id number] DOB:12-Jan-1966, 56 y.o., female Today's Date: 01/21/2022   PT End of Session - 01/21/22 1401     Visit Number 9    Number of Visits 10    Date for PT Re-Evaluation 01/28/22    Authorization Type IE: 11/19/21    PT Start Time 1401    PT Stop Time 1441    PT Time Calculation (min) 40 min    Activity Tolerance Patient tolerated treatment well             Past Medical History:  Diagnosis Date   Anxiety and depression    BRCA negative 10/2017   MyRisk neg except CDKN2A VUS; IBIS=9.3%   Family history of ovarian cancer    MyRisk neg   GERD (gastroesophageal reflux disease)    H/O nutritional disorder 08/01/2014   Headache, migraine 04/06/2014   Hemorrhoids    IBS (irritable bowel syndrome)    Leiomyoma of uterus    Lumbar radiculopathy 06/17/2014   Overview:  S/p bilateral lumbar decompression L4-5 and L5-S1 s/p excision of large central/left disc herniation 09/16/08 with resolution of symptoms recurrent in 2016 with dx of L5-S1 radiculopathy per Dr. Elmer Sow  [place], [facility] [place]. Report scanned.    Menorrhagia    Stomach ulcer    Past Surgical History:  Procedure Laterality Date   ABDOMINAL HYSTERECTOMY     BACK SURGERY  2010   CESAREAN SECTION  2002   CHOLECYSTECTOMY  2010   COLONOSCOPY     ECTOPIC PREGNANCY SURGERY  2001, 2004   OOPHORECTOMY     TOTAL LAPAROSCOPIC HYSTERECTOMY WITH SALPINGECTOMY  2014   TUBAL LIGATION  2004   Patient Active Problem List   Diagnosis Date Noted   Anxiety 12/11/2020   Nausea and vomiting in adult 01/03/2020   Right knee pain 12/13/2019   Poison ivy 11/02/2018   Peroneal tendinitis 03/02/2018   Acute left ankle pain 02/03/2018   Stable angina pectoris 08/26/2016   Gastroesophageal reflux disease without esophagitis 08/01/2016   Hyperglycemia 08/01/2016   Family history of ovarian cancer 07/10/2016   Incontinence 09/11/2015   SUI  (stress urinary incontinence, female) 09/11/2015   S/P hysterectomy 08/02/2015   H/O nutritional disorder 08/01/2014   Chronic lumbar radiculopathy 06/17/2014   Chronic migraine 04/06/2014    PCP: Mychal Beckie Busing, PA-C  REFERRING PROVIDER: Chad Cordial, PA-C   REFERRING DIAG: N39.3 (ICD-10-CM) - SUI (stress urinary incontinence, female)   THERAPY DIAG:  Pelvic floor dysfunction  Muscle weakness (generalized)  Other lack of coordination  Rationale for Evaluation and Treatment: Rehabilitation  ONSET DATE: A few years  PRECAUTIONS: None  WEIGHT BEARING RESTRICTIONS: No  FALLS:  Has patient fallen in last 6 months? No  OCCUPATION/SOCIAL ACTIVITIES: Caregiver, substitute teacher, running errands    CHIEF CONCERN: Pt reports she feels her weight is contributing some but she has had problems controlling her bladder leading to urinary leakage. Pt has been taking Gentessa for OAB and was working well but insurance does not cover. Pt is now taking Oxybutin. Pt does not want to have surgery because she is the caregiver in her family. Pt has to wear incontinence briefs when she is sick (vomiting) or has increased coughing spells due to newly dx asthma.     PATIENT GOALS: Pt would like not to wear pads all the time or decrease the Lvl, Pt would like to be able to laugh/cough/sneeze without leakage    UROLOGICAL HISTORY Fluid intake: water, coffee   Pain with urination: No Fully empty bladder: No, some dribbling  Toileting posture: heels raised Stream: Strong Urgency: No Frequency: 7x Nocturia: 1x Leakage: Urge to void, Coughing, Sneezing, Lifting, Bending forward, and Intercourse Pads: Yes Type: Lvl 5 pads Poise Amount: 3x/day, wearing at night  Bladder control (0-10): 4/10    GASTROINTESTINAL HISTORY Pain with bowel movement: No Type of bowel movement:Type (Bristol Stool Scale) 3 and Strain Yes Frequency: 1x/day Fully empty rectum: Yes Leakage: No   SEXUAL HISTORY/FUNCTION Pain with intercourse: During Penetration and Pain Interrupts Intercourse Ability to have vaginal penetration:  Yes; Deep thrusting: Yes Able to achieve orgasm?: Yes  OBSTETRICAL HISTORY Vaginal deliveries: G5P3 Tearing: tearing with one  C-section deliveries: one  Currently pregnant: No  GYNECOLOGICAL HISTORY Hysterectomy: yes, abdominal  Pelvic Organ Prolapse: None Pain with exam: no Heaviness/pressure: no     SUBJECTIVE:  Pt is doing well this past week and did not have urinary leakage with traveling.   PAIN:  Are you having pain? No    OBJECTIVE:    COGNITION: Overall cognitive status: Within functional limits for tasks assessed     POSTURE:              Iliac crest height: L iliac crest, L shoulder elevated Pelvic obliquity: WNL   RANGE OF MOTION:    (Norm range in degrees)  LEFT 11/27/21 RIGHT 11/27/21  Lumbar forward flexion (65):  WNL    Lumbar extension (30): WNL    Lumbar lateral flexion (25):  WNL Restricted   Thoracic and Lumbar rotation (30 degrees):    WNL WNL  Hip Flexion (0-125):   WNL WNL  Hip IR (0-45):  WNL WNL  Hip ER (0-45):  WNL WNL  Hip Adduction:      Hip Abduction (0-40):  WNL WNL  Hip extension (0-15):     (*= pain, Blank rows = not tested)   STRENGTH: MMT    RLE 11/27/21 LLE 11/27/21  Hip Flexion 4 4  Hip Extension 5 5  Hip Abduction     Hip Adduction     Hip ER  4 4  Hip IR  5 5  Knee Extension 5 4  Knee Flexion 5 5  Dorsiflexion     Plantarflexion (seated) 5 5  (*= pain, Blank rows = not tested)   SPECIAL TESTS:   FABER (SN 81): negative B FADIR (SN 94): negative B   PALPATION:  Abdominal:  Diastasis: will assess next visit  Scar mobility: present, c-section - increased scar restriction more towards R>L  with increasing "stabbing" sensation  -Center of abdomen and across upper quadrants  with increased fascial tension but no discomfort    EXTERNAL PELVIC EXAM: Patient educated on the purpose of the pelvic exam and articulated understanding; patient consented to the exam verbally.  Breath coordination: present Voluntary Contraction: present, 3/5 MMT with Valsalva noted Relaxation: full Perineal movement with sustained IAP increase ("bear down"): descent Perineal movement with rapid IAP increase ("cough"): no change (0= no contraction, 1= flicker, 2= weak squeeze, 3= fair squeeze with lift, 4= good squeeze and lift against resistance, 5= strong squeeze against strong resistance)  TODAY'S TREATMENT  Neuromuscular Re-education: Seated diaphragmatic breathing with VCs and TCs for downregulation of the nervous system and improved management of IAP  Modified dead bug/bird dog in seated with TrA contraction (UE/LE challenge) for improved IAP management, VCs and TCs required   Attempted pallof press with GTB for improved IAP management, bothersome to Pt's R knee and activating PFM, discontinued   Attempted wall squats with breath but bothersome to Pt's R knee.   Standing mermaid pose at wall with coordinated breath for improved IAP management, VCs and TCs required   Patient response to interventions: Pt able to feel TrA activation more posteriorly with mermaid pose   Patient Education:  Patient provided with HEP including: seated bird dog/dead bug with breath, mermaid pose. Patient verbalized understanding and returned demonstration. Patient will benefit from further education in order to maximize compliance and understanding for long-term therapeutic gains.     ASSESSMENT:  Clinical Impression: Patient presents to clinic with excellent motivation to participate in today's session. Pt continues to demonstrate deficits IAP management, PFM coordination, LE strength, posture, pain, and  scar mobility. Discussion on TrA activation modifications in standing due to R knee pain/stability and possibility of a R knee brace as Pt used to wear in the past. Pt unable to perform pallof press without discomfort at the R knee and PFM activation (increased sensation of having to urinate). Pt required moderate VCs and TCs for various TrA activation in seated and standing (mermaid pose). Pt responded well to active and educational interventions. Pt will continue to benefit from skilled therapeutic interventions to address deficits in IAP management, PFM coordination, LE strength, posture, pain, and scar mobility to increase PLOF and improve overall QOL.    Objective Impairments: decreased coordination, decreased endurance, decreased strength, increased fascial restrictions, improper body mechanics, postural dysfunction, and pain.   Activity Limitations: lifting, bending, standing, continence, toileting, locomotion level, and caring for others  Personal Factors: Age, Behavior pattern, Past/current experiences, Time since onset of injury/illness/exacerbation, and 1-2 comorbidities: asthma and diabetes  are also affecting patient's functional outcome.   Rehab Potential: Good  Clinical Decision Making: Evolving/moderate complexity  Evaluation Complexity: Moderate   GOALS: Goals reviewed with patient? Yes  SHORT TERM GOALS: Target date: 12/24/2021  Patient will demonstrate independence with HEP in order to maximize therapeutic gains and improve carryover from physical therapy sessions to ADLs in the home and community. Baseline: toileting posture handout Goal status: INITIAL    LONG TERM GOALS: Target date: 01/28/2022   Patient will score  >/= 61 on FOTO Urinary Problem in order to demonstrate improved IAP management, improved PFM coordination, and overall QOL.  Baseline: 52; (10/25): 58  Goal status: IN PROGRESS  2.  Patient will report confidence in ability to control bladder > 7/10 in  order to demonstrate improved function and ability to participate more fully in activities at home and in the community. Baseline: 4/10 Goal status: INITIAL  3.  Patient will demonstrate independent and  coordinated diaphragmatic breathing in supine with a 1:2 breathing pattern for improved down-regulation of the nervous system and improved management of intra-abdominal pressures in order to increase function at home and in the community. Baseline: will assess next visit, (9/11): Valsalva maneuver with PFM strength test   Goal status: INITIAL  4.  Patient will report decreased reliance on protective undergarments as indicated by a 24 hour period to demonstrate improved bladder control and allow for increased participation in activities outside of the home. Baseline: Lvl 5 Poise at least 3x/day/incontinence briefs when sick; (10/25): Lvl 4 not all may be soaked/3x but more for hygiene and occasionally wet/has not been sick  Goal status: IN PROGRESS  5.  Patient will report less than 5 incidents of stress urinary incontinence over the course of 3 weeks while coughing/sneezing/laughing/prolonged activity/bending in order to demonstrate improved PFM coordination, strength, and function for improved overall QOL. Baseline: urinary leakage with all the above Goal status: INITIAL   PLAN: PT Frequency: 1x/week  PT Duration: 10 weeks  Planned Interventions: Therapeutic exercises, Therapeutic activity, Neuromuscular re-education, Balance training, Gait training, Patient/Family education, Self Care, Joint mobilization, Spinal mobilization, Cryotherapy, Moist heat, scar mobilization, Taping, and Manual therapy  Plan For Next Session:  progress note, wall plank then bird dog, stand at wall and arm T's, standing overhead press with weight or SLS    Millissa Deese, PT, DPT  01/21/2022, 2:54 PM

## 2022-01-30 ENCOUNTER — Ambulatory Visit: Payer: Commercial Managed Care - PPO | Attending: Obstetrics and Gynecology

## 2022-01-30 DIAGNOSIS — M6281 Muscle weakness (generalized): Secondary | ICD-10-CM | POA: Diagnosis present

## 2022-01-30 DIAGNOSIS — M6289 Other specified disorders of muscle: Secondary | ICD-10-CM | POA: Diagnosis not present

## 2022-01-30 DIAGNOSIS — R278 Other lack of coordination: Secondary | ICD-10-CM | POA: Diagnosis present

## 2022-01-30 NOTE — Therapy (Signed)
OUTPATIENT PHYSICAL THERAPY FEMALE PELVIC RE-CERT/PROGRESS NOTE  FROM REPORTING PERIODS 11/19/21 - 01/30/22   Patient Name: Katrina Munoz MRN: [id number] DOB:03-Dec-1965, 56 y.o., female Today's Date: 01/30/2022   PT End of Session - 01/30/22 1447     Visit Number 10    Number of Visits 20    Date for PT Re-Evaluation 04/10/22    Authorization Type IE: 11/19/21; PN/RC: 01/30/22    PT Start Time 0245    PT Stop Time 0325    PT Time Calculation (min) 40 min    Activity Tolerance Patient tolerated treatment well             Past Medical History:  Diagnosis Date   Anxiety and depression    BRCA negative 10/2017   MyRisk neg except CDKN2A VUS; IBIS=9.3%   Family history of ovarian cancer    MyRisk neg   GERD (gastroesophageal reflux disease)    H/O nutritional disorder 08/01/2014   Headache, migraine 04/06/2014   Hemorrhoids    IBS (irritable bowel syndrome)    Leiomyoma of uterus    Lumbar radiculopathy 06/17/2014   Overview:  S/p bilateral lumbar decompression L4-5 and L5-S1 s/p excision of large central/left disc herniation 09/16/08 with resolution of symptoms recurrent in 2016 with dx of L5-S1 radiculopathy per Dr. Elmer Sow  [place], [facility]. Report scanned.    Menorrhagia    Stomach ulcer    Past Surgical History:  Procedure Laterality Date   ABDOMINAL HYSTERECTOMY     BACK SURGERY  2010   CESAREAN SECTION  2002   CHOLECYSTECTOMY  2010   COLONOSCOPY     ECTOPIC PREGNANCY SURGERY  2001, 2004   OOPHORECTOMY     TOTAL LAPAROSCOPIC HYSTERECTOMY WITH SALPINGECTOMY  2014   TUBAL LIGATION  2004   Patient Active Problem List   Diagnosis Date Noted   Anxiety 12/11/2020   Nausea and vomiting in adult 01/03/2020   Right knee pain 12/13/2019   Poison ivy 11/02/2018   Peroneal tendinitis 03/02/2018   Acute left ankle pain 02/03/2018   Stable angina pectoris 08/26/2016   Gastroesophageal reflux disease without esophagitis 08/01/2016   Hyperglycemia 08/01/2016   Family  history of ovarian cancer 07/10/2016   Incontinence 09/11/2015   SUI (stress urinary incontinence, female) 09/11/2015   S/P hysterectomy 08/02/2015   H/O nutritional disorder 08/01/2014   Chronic lumbar radiculopathy 06/17/2014   Chronic migraine 04/06/2014    PCP: Mychal Beckie Busing, PA-C  REFERRING PROVIDER: Chad Cordial, PA-C   REFERRING DIAG: N39.3 (ICD-10-CM) - SUI (stress urinary incontinence, female)   THERAPY DIAG:  Pelvic floor dysfunction  Muscle weakness (generalized)  Other lack of coordination  Rationale for Evaluation and Treatment: Rehabilitation  ONSET DATE: A few years  PRECAUTIONS: None  WEIGHT BEARING RESTRICTIONS: No  FALLS:  Has patient fallen in last 6 months? No  OCCUPATION/SOCIAL ACTIVITIES: Caregiver, substitute teacher, running errands    CHIEF CONCERN: Pt reports she feels her weight is contributing some but she has had problems controlling her bladder leading to urinary leakage. Pt has been taking Gentessa for OAB and was working well but insurance does not cover. Pt is now taking Oxybutin. Pt does not want to have surgery because she is the caregiver in her family. Pt has to wear incontinence briefs when she is sick (vomiting) or has increased coughing spells due to newly dx asthma.     PATIENT GOALS: Pt would like not to wear pads all the time or decrease the Lvl, Pt would like to be able to laugh/cough/sneeze without leakage    UROLOGICAL HISTORY Fluid intake: water, coffee   Pain with urination: No Fully empty bladder: No, some dribbling  Toileting posture: heels raised Stream: Strong Urgency: No Frequency: 7x Nocturia: 1x Leakage: Urge to void, Coughing, Sneezing, Lifting, Bending forward, and Intercourse Pads: Yes Type: Lvl 5 pads Poise Amount:  3x/day, wearing at night  Bladder control (0-10): 4/10   GASTROINTESTINAL HISTORY Pain with bowel movement: No Type of bowel movement:Type (Bristol Stool Scale) 3 and Strain Yes Frequency: 1x/day Fully empty rectum: Yes Leakage: No   SEXUAL HISTORY/FUNCTION Pain with intercourse: During Penetration and Pain Interrupts Intercourse Ability to have vaginal penetration:  Yes; Deep thrusting: Yes Able to achieve orgasm?: Yes  OBSTETRICAL HISTORY Vaginal deliveries: G5P3 Tearing: tearing with one  C-section deliveries: one  Currently pregnant: No  GYNECOLOGICAL HISTORY Hysterectomy: yes, abdominal  Pelvic Organ Prolapse: None Pain with exam: no Heaviness/pressure: no     SUBJECTIVE:  Pt is doing well and has noticed a big difference in less straining.   PAIN:  Are you having pain? No    OBJECTIVE:    COGNITION: Overall cognitive status: Within functional limits for tasks assessed     POSTURE:              Iliac crest height: L iliac crest, L shoulder elevated Pelvic obliquity: WNL   RANGE OF MOTION:    (Norm range in degrees)  LEFT 11/27/21 RIGHT 11/27/21  Lumbar forward flexion (65):  WNL    Lumbar extension (30): WNL    Lumbar lateral flexion (25):  WNL Restricted   Thoracic and Lumbar rotation (30 degrees):    WNL WNL  Hip Flexion (0-125):   WNL WNL  Hip IR (0-45):  WNL WNL  Hip ER (0-45):  WNL WNL  Hip Adduction:      Hip Abduction (0-40):  WNL WNL  Hip extension (0-15):     (*= pain, Blank rows = not tested)   STRENGTH: MMT    RLE 11/27/21 LLE 11/27/21  Hip Flexion 4 4  Hip Extension 5 5  Hip Abduction     Hip Adduction     Hip ER  4 4  Hip IR  5 5  Knee Extension 5 4  Knee Flexion 5 5  Dorsiflexion     Plantarflexion (seated) 5 5  (*= pain, Blank rows = not tested)   SPECIAL TESTS:   FABER (SN 81): negative B FADIR (SN 94): negative B   PALPATION:  Abdominal:  Diastasis: will assess next visit  Scar mobility: present, c-section -  increased scar restriction more towards R>L with increasing "stabbing" sensation  -Center of abdomen and across upper quadrants with increased  fascial tension but no discomfort    EXTERNAL PELVIC EXAM: Patient educated on the purpose of the pelvic exam and articulated understanding; patient consented to the exam verbally.  Breath coordination: present Voluntary Contraction: present, 3/5 MMT with Valsalva noted Relaxation: full Perineal movement with sustained IAP increase ("bear down"): descent Perineal movement with rapid IAP increase ("cough"): no change (0= no contraction, 1= flicker, 2= weak squeeze, 3= fair squeeze with lift, 4= good squeeze and lift against resistance, 5= strong squeeze against strong resistance)  TODAY'S TREATMENT  Neuromuscular Re-education: Reassessment of FOTO: Urinary Problem - 64 (2 weeks ago was a 58)  Discussion on significant progress but having once or less per week or a little difficulty   Reassessment of LTGs and STGs below:  3/6 goals met and reviewed with Pt   Standing wall plank with coordinated breathing for improved IAP management and postural stability, VCs and TCs required    Patient response to interventions: Pt able to feel TrA activation more posteriorly during wall plank   Patient Education:  Patient provided with HEP including: standing wall plank. Patient verbalized understanding and returned demonstration. Patient will benefit from further education in order to maximize compliance and understanding for long-term therapeutic gains.     ASSESSMENT:  Clinical Impression: Patient presents to clinic with excellent motivation to participate in today's reassessment/progress note. Pt has made significant progress from IE on 11/19/21. Based on FOTO Urinary Problem (64), Pt has improved with urinary leakage (once per week or less) and confidence in the bladder (7/10) compared to IE (4/10 and once or more leakage episodes per day). Pt also  reports improved BMs with no straining. Toileting posture has helped significantly. In relation to urinary leakage, Pt has no more leakage with bending/squatting/light coughing. Although Pt has improved and met 3/6 goals below, Pt continues to have urinary leakage with vigorous coughing, sneezing, and laughing requiring use of incontinence pads (Lvl 4), though each pad has minimal leakage compared to IE (maximum leakage - soaked the pad). Pt required moderate VCs and TCs during progression of TrA activation in standing for proper technique and to decrease bodily compensations. Will continue to improve IAP management and challenge Pt in anti-gravity positions. Pt responded well to active and educational interventions and will continue to benefit from skilled therapeutic interventions to address deficits in IAP management, PFM coordination, LE strength, posture, pain, and scar mobility to increase PLOF and improve overall QOL.    Objective Impairments: decreased coordination, decreased endurance, decreased strength, increased fascial restrictions, improper body mechanics, postural dysfunction, and pain.   Activity Limitations: lifting, bending, standing, continence, toileting, locomotion level, and caring for others  Personal Factors: Age, Behavior pattern, Past/current experiences, Time since onset of injury/illness/exacerbation, and 1-2 comorbidities: asthma and diabetes  are also affecting patient's functional outcome.   Rehab Potential: Good  Clinical Decision Making: Evolving/moderate complexity  Evaluation Complexity: Moderate   GOALS: Goals reviewed with patient? Yes  SHORT TERM GOALS: Target date: 12/24/2021  Patient will demonstrate independence with HEP in order to maximize therapeutic gains and improve carryover from physical therapy sessions to ADLs in the home and community. Baseline: toileting posture handout; (11/8): Pt continues to be IND with HEP  Goal status: MET    LONG  TERM GOALS: Target date: 04/10/2022   Patient will score  >/= 61 on FOTO Urinary Problem in order to demonstrate improved IAP management, improved PFM coordination, and overall QOL.  Baseline: 52; (10/25): 58; (11/8): 64 Goal status: MET  2.  Patient will report confidence in ability to control bladder > 7/10 in order to demonstrate improved function and ability to participate more fully in activities at home and in the community. Baseline: 4/10; (11/8): 7/10 Goal status: MET  3.  Patient will demonstrate independent and coordinated diaphragmatic breathing in supine with a 1:2 breathing pattern for improved down-regulation of the nervous system and improved management of intra-abdominal pressures in order to increase function at home and in the community. Baseline: will assess next visit, (9/11): Valsalva maneuver with PFM strength test; (11/8): improved breathing in sitting and supine, will continue to work in standing (increase shoulder elevation) Goal status: IN PROGRESS  4.  Patient will report decreased reliance on protective undergarments as indicated by a 24 hour period to demonstrate improved bladder control and allow for increased participation in activities outside of the home. Baseline: Lvl 5 Poise at least 3x/day/incontinence briefs when sick; (10/25): Lvl 4 not all may be soaked/3x but more for hygiene and occasionally wet/has not been sick; (11/8): Lvl 4/3x but more damp   Goal status: IN PROGRESS  5.  Patient will report less than 5 incidents of stress urinary incontinence over the course of 3 weeks while coughing/sneezing/laughing/prolonged activity/bending in order to demonstrate improved PFM coordination, strength, and function for improved overall QOL. Baseline: urinary leakage with all the above; (11/8): bending no urinary leakage, coughing is better but with sneezing still having urinary leakage each time and laughing  Goal status: IN PROGRESS   PLAN: PT Frequency:  1x/week  PT Duration: 10 weeks  Planned Interventions: Therapeutic exercises, Therapeutic activity, Neuromuscular re-education, Balance training, Gait training, Patient/Family education, Self Care, Joint mobilization, Spinal mobilization, Cryotherapy, Moist heat, scar mobilization, Taping, and Manual therapy  Plan For Next Session: "the knack", wall plank then bird dog, stand at wall and arm T's, standing overhead press with weight or SLS    Shaunte Weissinger, PT, DPT  01/30/2022, 4:31 PM

## 2022-02-07 ENCOUNTER — Ambulatory Visit: Payer: Commercial Managed Care - PPO

## 2022-02-07 DIAGNOSIS — M6281 Muscle weakness (generalized): Secondary | ICD-10-CM

## 2022-02-07 DIAGNOSIS — M6289 Other specified disorders of muscle: Secondary | ICD-10-CM

## 2022-02-07 DIAGNOSIS — R278 Other lack of coordination: Secondary | ICD-10-CM

## 2022-02-07 NOTE — Therapy (Signed)
OUTPATIENT PHYSICAL THERAPY FEMALE PELVIC TREATMENT   Patient Name: Katrina Munoz MRN: [id number] DOB:07-05-1965, 56 y.o., female Today's Date: 02/07/2022   PT End of Session - 02/07/22 1115     Visit Number 11    Number of Visits 20    Date for PT Re-Evaluation 04/10/22    Authorization Type IE: 11/19/21; PN/RC: 01/30/22    PT Start Time 1113    PT Stop Time 1140    PT Time Calculation (min) 27 min    Activity Tolerance Patient tolerated treatment well             Past Medical History:  Diagnosis Date   Anxiety and depression    BRCA negative 10/2017   MyRisk neg except CDKN2A VUS; IBIS=9.3%   Family history of ovarian cancer    MyRisk neg   GERD (gastroesophageal reflux disease)    H/O nutritional disorder 08/01/2014   Headache, migraine 04/06/2014   Hemorrhoids    IBS (irritable bowel syndrome)    Leiomyoma of uterus    Lumbar radiculopathy 06/17/2014   Overview:  S/p bilateral lumbar decompression L4-5 and L5-S1 s/p excision of large central/left disc herniation 09/16/08 with resolution of symptoms recurrent in 2016 with dx of L5-S1 radiculopathy per Dr. Elmer Sow  [place], [facility]. Report scanned.    Menorrhagia    Stomach ulcer    Past Surgical History:  Procedure Laterality Date   ABDOMINAL HYSTERECTOMY     BACK SURGERY  2010   CESAREAN SECTION  2002   CHOLECYSTECTOMY  2010   COLONOSCOPY     ECTOPIC PREGNANCY SURGERY  2001, 2004   OOPHORECTOMY     TOTAL LAPAROSCOPIC HYSTERECTOMY WITH SALPINGECTOMY  2014   TUBAL LIGATION  2004   Patient Active Problem List   Diagnosis Date Noted   Anxiety 12/11/2020   Nausea and vomiting in adult 01/03/2020   Right knee pain 12/13/2019   Poison ivy 11/02/2018   Peroneal tendinitis 03/02/2018   Acute left ankle pain 02/03/2018   Stable angina pectoris 08/26/2016   Gastroesophageal reflux disease without esophagitis 08/01/2016   Hyperglycemia 08/01/2016   Family history of ovarian cancer 07/10/2016   Incontinence  09/11/2015   SUI (stress urinary incontinence, female) 09/11/2015   S/P hysterectomy 08/02/2015   H/O nutritional disorder 08/01/2014   Chronic lumbar radiculopathy 06/17/2014   Chronic migraine 04/06/2014    PCP: Mychal Beckie Busing, PA-C  REFERRING PROVIDER: Chad Cordial, PA-C   REFERRING DIAG: N39.3 (ICD-10-CM) - SUI (stress urinary incontinence, female)   THERAPY DIAG:  Pelvic floor dysfunction  Muscle weakness (generalized)  Other lack of coordination  Rationale for Evaluation and Treatment: Rehabilitation  ONSET DATE: A few years  PRECAUTIONS: None  WEIGHT BEARING RESTRICTIONS: No  FALLS:  Has patient fallen in last 6 months? No  OCCUPATION/SOCIAL ACTIVITIES: Caregiver, substitute teacher, running errands    CHIEF CONCERN: Pt reports she feels her weight is contributing some but she has had problems controlling her bladder leading to urinary leakage. Pt has been taking Gentessa for OAB and was working well but insurance does not cover. Pt is now taking Oxybutin. Pt does not want to have surgery because she is the caregiver in her family. Pt has to wear incontinence briefs when she is sick (vomiting) or has increased coughing spells due to newly dx asthma.     PATIENT GOALS: Pt would like not to wear pads all the time or decrease the Lvl, Pt would like to be able to laugh/cough/sneeze without leakage    UROLOGICAL HISTORY Fluid intake: water, coffee   Pain with urination: No Fully empty bladder: No, some dribbling  Toileting posture: heels raised Stream: Strong Urgency: No Frequency: 7x Nocturia: 1x Leakage: Urge to void, Coughing, Sneezing, Lifting, Bending forward, and Intercourse Pads: Yes Type: Lvl 5 pads Poise Amount: 3x/day, wearing at night  Bladder control (0-10):  4/10   GASTROINTESTINAL HISTORY Pain with bowel movement: No Type of bowel movement:Type (Bristol Stool Scale) 3 and Strain Yes Frequency: 1x/day Fully empty rectum: Yes Leakage: No   SEXUAL HISTORY/FUNCTION Pain with intercourse: During Penetration and Pain Interrupts Intercourse Ability to have vaginal penetration:  Yes; Deep thrusting: Yes Able to achieve orgasm?: Yes  OBSTETRICAL HISTORY Vaginal deliveries: G5P3 Tearing: tearing with one  C-section deliveries: one  Currently pregnant: No  GYNECOLOGICAL HISTORY Hysterectomy: yes, abdominal  Pelvic Organ Prolapse: None Pain with exam: no Heaviness/pressure: no     SUBJECTIVE:  Pt arrived 13 mins after scheduled appt. Pt was able to perform household chores and moving furniture with minimal urinary leakage.   PAIN:  Are you having pain? No    OBJECTIVE:    COGNITION: Overall cognitive status: Within functional limits for tasks assessed     POSTURE:              Iliac crest height: L iliac crest, L shoulder elevated Pelvic obliquity: WNL   RANGE OF MOTION:    (Norm range in degrees)  LEFT 11/27/21 RIGHT 11/27/21  Lumbar forward flexion (65):  WNL    Lumbar extension (30): WNL    Lumbar lateral flexion (25):  WNL Restricted   Thoracic and Lumbar rotation (30 degrees):    WNL WNL  Hip Flexion (0-125):   WNL WNL  Hip IR (0-45):  WNL WNL  Hip ER (0-45):  WNL WNL  Hip Adduction:      Hip Abduction (0-40):  WNL WNL  Hip extension (0-15):     (*= pain, Blank rows = not tested)   STRENGTH: MMT    RLE 11/27/21 LLE 11/27/21  Hip Flexion 4 4  Hip Extension 5 5  Hip Abduction     Hip Adduction     Hip ER  4 4  Hip IR  5 5  Knee Extension 5 4  Knee Flexion 5 5  Dorsiflexion     Plantarflexion (seated) 5 5  (*= pain, Blank rows = not tested)   SPECIAL TESTS:   FABER (SN 81): negative B FADIR (SN 94): negative B   PALPATION:  Abdominal:  Diastasis: will assess next visit  Scar mobility: present,  c-section - increased scar restriction more towards R>L with increasing "stabbing" sensation  -Center  of abdomen and across upper quadrants with increased fascial tension but no discomfort    EXTERNAL PELVIC EXAM: Patient educated on the purpose of the pelvic exam and articulated understanding; patient consented to the exam verbally.  Breath coordination: present Voluntary Contraction: present, 3/5 MMT with Valsalva noted Relaxation: full Perineal movement with sustained IAP increase ("bear down"): descent Perineal movement with rapid IAP increase ("cough"): no change (0= no contraction, 1= flicker, 2= weak squeeze, 3= fair squeeze with lift, 4= good squeeze and lift against resistance, 5= strong squeeze against strong resistance)  TODAY'S TREATMENT  Neuromuscular Re-education: Review of standing wall plank  Standing wall plank with coordinated breathing for improved IAP management and postural stability, VCs and TCs required   Standing wall plank with LE challenge on coordinated exhale, VCs and TCs required   Wall squat with UE challenge (forward rise then T's) with coordinated exhale, VCs and TCs required    Patient response to interventions: Pt comfortable to progress to every other week after next visit   Patient Education:  Patient provided with HEP including: standing modified bird dog and wall squat hold with UE challenges. Patient verbalized understanding and returned demonstration. Patient will benefit from further education in order to maximize compliance and understanding for long-term therapeutic gains.     ASSESSMENT:  Clinical Impression: Patient presents to clinic with excellent motivation to participate in today's session. Pt continues to demonstrate deficits in IAP management, PFM coordination, LE strength, posture, pain, and scar mobility. Pt noticed improved urinary leakage especially during more vigorous household chores but continues to wear Lvl 4  incontinence pad. Pt required moderate VCs and TCs during progression of TrA activation in standing for proper technique and to decrease bodily compensations. Pt responded well to active and educational interventions and will continue to benefit from skilled therapeutic interventions to address deficits in IAP management, PFM coordination, LE strength, posture, pain, and scar mobility to increase PLOF and improve overall QOL.    Objective Impairments: decreased coordination, decreased endurance, decreased strength, increased fascial restrictions, improper body mechanics, postural dysfunction, and pain.   Activity Limitations: lifting, bending, standing, continence, toileting, locomotion level, and caring for others  Personal Factors: Age, Behavior pattern, Past/current experiences, Time since onset of injury/illness/exacerbation, and 1-2 comorbidities: asthma and diabetes  are also affecting patient's functional outcome.   Rehab Potential: Good  Clinical Decision Making: Evolving/moderate complexity  Evaluation Complexity: Moderate   GOALS: Goals reviewed with patient? Yes  SHORT TERM GOALS: Target date: 12/24/2021  Patient will demonstrate independence with HEP in order to maximize therapeutic gains and improve carryover from physical therapy sessions to ADLs in the home and community. Baseline: toileting posture handout; (11/8): Pt continues to be IND with HEP  Goal status: MET    LONG TERM GOALS: Target date: 04/10/2022   Patient will score  >/= 61 on FOTO Urinary Problem in order to demonstrate improved IAP management, improved PFM coordination, and overall QOL.  Baseline: 52; (10/25): 58; (11/8): 64 Goal status: MET  2.  Patient will report confidence in ability to control bladder > 7/10 in order to demonstrate improved function and ability to participate more fully in activities at home and in the community. Baseline: 4/10; (11/8): 7/10 Goal status: MET  3.  Patient will  demonstrate independent and coordinated diaphragmatic breathing in supine with a 1:2 breathing pattern for improved down-regulation of the nervous system and improved management of intra-abdominal pressures in order to increase function at home and in the community. Baseline:  will assess next visit, (9/11): Valsalva maneuver with PFM strength test; (11/8): improved breathing in sitting and supine, will continue to work in standing (increase shoulder elevation) Goal status: IN PROGRESS  4.  Patient will report decreased reliance on protective undergarments as indicated by a 24 hour period to demonstrate improved bladder control and allow for increased participation in activities outside of the home. Baseline: Lvl 5 Poise at least 3x/day/incontinence briefs when sick; (10/25): Lvl 4 not all may be soaked/3x but more for hygiene and occasionally wet/has not been sick; (11/8): Lvl 4/3x but more damp   Goal status: IN PROGRESS  5.  Patient will report less than 5 incidents of stress urinary incontinence over the course of 3 weeks while coughing/sneezing/laughing/prolonged activity/bending in order to demonstrate improved PFM coordination, strength, and function for improved overall QOL. Baseline: urinary leakage with all the above; (11/8): bending no urinary leakage, coughing is better but with sneezing still having urinary leakage each time and laughing  Goal status: IN PROGRESS   PLAN: PT Frequency: 1x/week  PT Duration: 10 weeks  Planned Interventions: Therapeutic exercises, Therapeutic activity, Neuromuscular re-education, Balance training, Gait training, Patient/Family education, Self Care, Joint mobilization, Spinal mobilization, Cryotherapy, Moist heat, scar mobilization, Taping, and Manual therapy  Plan For Next Session:  "the knack", D2 with bands, standing overhead press with weight or SLS    Nichole Montour, PT, DPT  02/07/2022, 11:16 AM

## 2022-02-11 ENCOUNTER — Ambulatory Visit: Payer: Commercial Managed Care - PPO

## 2022-02-20 ENCOUNTER — Ambulatory Visit: Payer: Commercial Managed Care - PPO

## 2022-02-27 ENCOUNTER — Ambulatory Visit: Payer: Commercial Managed Care - PPO | Attending: Obstetrics and Gynecology

## 2022-02-27 DIAGNOSIS — M6289 Other specified disorders of muscle: Secondary | ICD-10-CM | POA: Insufficient documentation

## 2022-02-27 DIAGNOSIS — M6281 Muscle weakness (generalized): Secondary | ICD-10-CM | POA: Insufficient documentation

## 2022-02-27 DIAGNOSIS — R278 Other lack of coordination: Secondary | ICD-10-CM | POA: Diagnosis present

## 2022-02-27 NOTE — Therapy (Signed)
OUTPATIENT PHYSICAL THERAPY FEMALE PELVIC TREATMENT   Patient Name: Katrina Munoz MRN: [id number] DOB:10/26/1965, 56 y.o., female Today's Date: 02/27/2022   PT End of Session - 02/27/22 1013     Visit Number 12    Number of Visits 20    Date for PT Re-Evaluation 04/10/22    Authorization Type IE: 11/19/21; PN/RC: 01/30/22    PT Start Time 1015    PT Stop Time 1055    PT Time Calculation (min) 40 min    Activity Tolerance Patient tolerated treatment well             Past Medical History:  Diagnosis Date   Anxiety and depression    BRCA negative 10/2017   MyRisk neg except CDKN2A VUS; IBIS=9.3%   Family history of ovarian cancer    MyRisk neg   GERD (gastroesophageal reflux disease)    H/O nutritional disorder 08/01/2014   Headache, migraine 04/06/2014   Hemorrhoids    IBS (irritable bowel syndrome)    Leiomyoma of uterus    Lumbar radiculopathy 06/17/2014   Overview:  S/p bilateral lumbar decompression L4-5 and L5-S1 s/p excision of large central/left disc herniation 09/16/08 with resolution of symptoms recurrent in 2016 with dx of L5-S1 radiculopathy per Dr. Elmer Sow  [place], [facility]. Report scanned.    Menorrhagia    Stomach ulcer    Past Surgical History:  Procedure Laterality Date   ABDOMINAL HYSTERECTOMY     BACK SURGERY  2010   CESAREAN SECTION  2002   CHOLECYSTECTOMY  2010   COLONOSCOPY     ECTOPIC PREGNANCY SURGERY  2001, 2004   OOPHORECTOMY     TOTAL LAPAROSCOPIC HYSTERECTOMY WITH SALPINGECTOMY  2014   TUBAL LIGATION  2004   Patient Active Problem List   Diagnosis Date Noted   Anxiety 12/11/2020   Nausea and vomiting in adult 01/03/2020   Right knee pain 12/13/2019   Poison ivy 11/02/2018   Peroneal tendinitis 03/02/2018   Acute left ankle pain 02/03/2018   Stable angina pectoris 08/26/2016   Gastroesophageal reflux disease without esophagitis 08/01/2016   Hyperglycemia 08/01/2016   Family history of ovarian cancer 07/10/2016   Incontinence  09/11/2015   SUI (stress urinary incontinence, female) 09/11/2015   S/P hysterectomy 08/02/2015   H/O nutritional disorder 08/01/2014   Chronic lumbar radiculopathy 06/17/2014   Chronic migraine 04/06/2014    PCP: Mychal Beckie Busing, PA-C  REFERRING PROVIDER: Chad Cordial, PA-C   REFERRING DIAG: N39.3 (ICD-10-CM) - SUI (stress urinary incontinence, female)   THERAPY DIAG:  Pelvic floor dysfunction  Muscle weakness (generalized)  Other lack of coordination  Rationale for Evaluation and Treatment: Rehabilitation  ONSET DATE: A few years  PRECAUTIONS: None  WEIGHT BEARING RESTRICTIONS: No  FALLS:  Has patient fallen in last 6 months? No  OCCUPATION/SOCIAL ACTIVITIES: Caregiver, substitute teacher, running errands    CHIEF CONCERN: Pt reports she feels her weight is contributing some but she has had problems controlling her bladder leading to urinary leakage. Pt has been taking Gentessa for OAB and was working well but insurance does not cover. Pt is now taking Oxybutin. Pt does not want to have surgery because she is the caregiver in her family. Pt has to wear incontinence briefs when she is sick (vomiting) or has increased coughing spells due to newly dx asthma.     PATIENT GOALS: Pt would like not to wear pads all the time or decrease the Lvl, Pt would like to be able to laugh/cough/sneeze without leakage    UROLOGICAL HISTORY Fluid intake: water, coffee   Pain with urination: No Fully empty bladder: No, some dribbling  Toileting posture: heels raised Stream: Strong Urgency: No Frequency: 7x Nocturia: 1x Leakage: Urge to void, Coughing, Sneezing, Lifting, Bending forward, and Intercourse Pads: Yes Type: Lvl 5 pads Poise Amount: 3x/day, wearing at night  Bladder control (0-10):  4/10   GASTROINTESTINAL HISTORY Pain with bowel movement: No Type of bowel movement:Type (Bristol Stool Scale) 3 and Strain Yes Frequency: 1x/day Fully empty rectum: Yes Leakage: No   SEXUAL HISTORY/FUNCTION Pain with intercourse: During Penetration and Pain Interrupts Intercourse Ability to have vaginal penetration:  Yes; Deep thrusting: Yes Able to achieve orgasm?: Yes  OBSTETRICAL HISTORY Vaginal deliveries: G5P3 Tearing: tearing with one  C-section deliveries: one  Currently pregnant: No  GYNECOLOGICAL HISTORY Hysterectomy: yes, abdominal  Pelvic Organ Prolapse: None Pain with exam: no Heaviness/pressure: no     SUBJECTIVE:  Pt walked in with B leg pain. Pt describes the pain as sharp. Pt has had chronic B knee pain for many years.   PAIN:  Are you having pain? Yes NPRS: 6/10, B legs anteriorly but no N/T    OBJECTIVE:    COGNITION: Overall cognitive status: Within functional limits for tasks assessed     POSTURE:              Iliac crest height: L iliac crest, L shoulder elevated Pelvic obliquity: WNL   RANGE OF MOTION:    (Norm range in degrees)  LEFT 11/27/21 RIGHT 11/27/21  Lumbar forward flexion (65):  WNL    Lumbar extension (30): WNL    Lumbar lateral flexion (25):  WNL Restricted   Thoracic and Lumbar rotation (30 degrees):    WNL WNL  Hip Flexion (0-125):   WNL WNL  Hip IR (0-45):  WNL WNL  Hip ER (0-45):  WNL WNL  Hip Adduction:      Hip Abduction (0-40):  WNL WNL  Hip extension (0-15):     (*= pain, Blank rows = not tested)   STRENGTH: MMT    RLE 11/27/21 LLE 11/27/21  Hip Flexion 4 4  Hip Extension 5 5  Hip Abduction     Hip Adduction     Hip ER  4 4  Hip IR  5 5  Knee Extension 5 4  Knee Flexion 5 5  Dorsiflexion     Plantarflexion (seated) 5 5  (*= pain, Blank rows = not tested)   SPECIAL TESTS:   FABER (SN 81): negative B FADIR (SN 94): negative B   PALPATION:  Abdominal:  Diastasis: will assess next visit  Scar  mobility: present, c-section - increased scar  restriction more towards R>L with increasing "stabbing" sensation  -Center of abdomen and across upper quadrants with increased fascial tension but no discomfort    EXTERNAL PELVIC EXAM: Patient educated on the purpose of the pelvic exam and articulated understanding; patient consented to the exam verbally.  Breath coordination: present Voluntary Contraction: present, 3/5 MMT with Valsalva noted Relaxation: full Perineal movement with sustained IAP increase ("bear down"): descent Perineal movement with rapid IAP increase ("cough"): no change (0= no contraction, 1= flicker, 2= weak squeeze, 3= fair squeeze with lift, 4= good squeeze and lift against resistance, 5= strong squeeze against strong resistance)  TODAY'S TREATMENT  Neuromuscular Re-education: Review of HEP and demonstration:  Standing wall plank with LE challenge on coordinated exhale for improved IAP management, VCs and TCs required   Wall squat with UE challenge, variation of forward flexion and abduction (T's with GTB), with coordinated exhale for improved IAP management, VCs and TCs required  2 lb dumbbells used for forward flexion   Discussion on returning in 2 weeks for discharge and continuing to practice mindfulness techniques and relaxation techniques to aid in decreasing PFM tension.   Patient response to interventions: Pt is comfortable to discharge next session    Patient Education:  Patient provided with HEP including: added UE weight (2lbs) with wall squat and standing T's with GTB. Patient verbalized understanding and returned demonstration. Patient will benefit from further education in order to maximize compliance and understanding for long-term therapeutic gains.     ASSESSMENT:  Clinical Impression: Patient presents to clinic with excellent motivation to participate in today's session. Pt continues to demonstrate deficits in IAP management, PFM  coordination, LE strength, posture, pain, and scar mobility. Pt with increased pain BLEs 6/10 described as sharp but as session continued Pt with decreased pain and pinpointed to B knees. Pt ended with 2/10 in B knees at end of session. Pt required moderate VCs and TCs during progression of TrA activation in standing for proper technique and to decrease bodily compensations. Pt responded well to active and educational interventions and will continue to benefit from skilled therapeutic interventions to address deficits in IAP management, PFM coordination, LE strength, posture, pain, and scar mobility to increase PLOF and improve overall QOL.    Objective Impairments: decreased coordination, decreased endurance, decreased strength, increased fascial restrictions, improper body mechanics, postural dysfunction, and pain.   Activity Limitations: lifting, bending, standing, continence, toileting, locomotion level, and caring for others  Personal Factors: Age, Behavior pattern, Past/current experiences, Time since onset of injury/illness/exacerbation, and 1-2 comorbidities: asthma and diabetes  are also affecting patient's functional outcome.   Rehab Potential: Good  Clinical Decision Making: Evolving/moderate complexity  Evaluation Complexity: Moderate   GOALS: Goals reviewed with patient? Yes  SHORT TERM GOALS: Target date: 12/24/2021  Patient will demonstrate independence with HEP in order to maximize therapeutic gains and improve carryover from physical therapy sessions to ADLs in the home and community. Baseline: toileting posture handout; (11/8): Pt continues to be IND with HEP  Goal status: MET    LONG TERM GOALS: Target date: 04/10/2022   Patient will score  >/= 61 on FOTO Urinary Problem in order to demonstrate improved IAP management, improved PFM coordination, and overall QOL.  Baseline: 52; (10/25): 58; (11/8): 64 Goal status: MET  2.  Patient will report confidence in ability to  control bladder > 7/10 in order to demonstrate improved function and ability to participate more fully in activities at home and in the community. Baseline: 4/10; (  11/8): 7/10 Goal status: MET  3.  Patient will demonstrate independent and coordinated diaphragmatic breathing in supine with a 1:2 breathing pattern for improved down-regulation of the nervous system and improved management of intra-abdominal pressures in order to increase function at home and in the community. Baseline: will assess next visit, (9/11): Valsalva maneuver with PFM strength test; (11/8): improved breathing in sitting and supine, will continue to work in standing (increase shoulder elevation) Goal status: IN PROGRESS  4.  Patient will report decreased reliance on protective undergarments as indicated by a 24 hour period to demonstrate improved bladder control and allow for increased participation in activities outside of the home. Baseline: Lvl 5 Poise at least 3x/day/incontinence briefs when sick; (10/25): Lvl 4 not all may be soaked/3x but more for hygiene and occasionally wet/has not been sick; (11/8): Lvl 4/3x but more damp   Goal status: IN PROGRESS  5.  Patient will report less than 5 incidents of stress urinary incontinence over the course of 3 weeks while coughing/sneezing/laughing/prolonged activity/bending in order to demonstrate improved PFM coordination, strength, and function for improved overall QOL. Baseline: urinary leakage with all the above; (11/8): bending no urinary leakage, coughing is better but with sneezing still having urinary leakage each time and laughing  Goal status: IN PROGRESS   PLAN: PT Frequency: 1x/week  PT Duration: 10 weeks  Planned Interventions: Therapeutic exercises, Therapeutic activity, Neuromuscular re-education, Balance training, Gait training, Patient/Family education, Self Care, Joint mobilization, Spinal mobilization, Cryotherapy, Moist heat, scar mobilization, Taping, and  Manual therapy  Plan For Next Session:  PFM strength, "the knack", D2 with bands, go over goals, discharge    [facility], PT, DPT  02/27/2022, 10:14 AM

## 2022-03-06 ENCOUNTER — Ambulatory Visit: Payer: Commercial Managed Care - PPO

## 2022-03-11 ENCOUNTER — Encounter: Payer: Self-pay | Admitting: Urology

## 2022-03-11 ENCOUNTER — Ambulatory Visit (INDEPENDENT_AMBULATORY_CARE_PROVIDER_SITE_OTHER): Payer: Commercial Managed Care - PPO | Admitting: Urology

## 2022-03-11 VITALS — BP 123/83 | HR 68 | Ht 61.0 in | Wt 245.0 lb

## 2022-03-11 DIAGNOSIS — N3946 Mixed incontinence: Secondary | ICD-10-CM

## 2022-03-11 LAB — URINALYSIS, COMPLETE
Bilirubin, UA: NEGATIVE
Glucose, UA: NEGATIVE
Ketones, UA: NEGATIVE
Leukocytes,UA: NEGATIVE
Nitrite, UA: NEGATIVE
Protein,UA: NEGATIVE
RBC, UA: NEGATIVE
Specific Gravity, UA: 1.02 (ref 1.005–1.030)
Urobilinogen, Ur: 0.2 mg/dL (ref 0.2–1.0)
pH, UA: 6.5 (ref 5.0–7.5)

## 2022-03-11 LAB — MICROSCOPIC EXAMINATION: Epithelial Cells (non renal): >10 /hpf — AB (ref 0–10)

## 2022-03-11 MED ORDER — OXYBUTYNIN CHLORIDE ER 10 MG PO TB24: 10.0000 mg | ORAL_TABLET | Freq: Every day | ORAL | 11 refills | Status: DC

## 2022-03-11 NOTE — Progress Notes (Signed)
03/11/2022 11:29 AM   Glendale Chard [birth year] [id number]  Referring provider: Hortencia Pilar, MD 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 She leaks with coughing and sneezing and sometimes bending and lifting. There is no question she can leak without awareness. She also has urgency incontinence and denies enuresis and is failed Kegel exercises and Miraberon. She really started Vesicare in its helping some. She wears 3-4 pads a day sometimes damp sometimes moderately wet   The last time the patient was here she had a grade 2 hypermobility of the bladder neck and a positive cough test. She had a grade 1 rectocele.    On urodynamics the patient had a reduced bladder capacity with bladder overactivity and a mild bladder outlet abnormality   Felt that she had a moderate overactive bladder with a mild to moderate outlet abnormality. Felt both were significant. I gave her oxybutynin last time. We then discussed the sling in detail with my usual template.   The patient is almost dry. Sometimes she leaks without awareness. If she gets a bad cold she will have stress incontinence. She truly thinks he oxybutynin is helped a lot with less urgency and frequency.    Continues to be very happy with the oxybutynin once a day.  Still has residual stress incontinence and does not wish to have surgery.  She is jogging a lot now and very happy.   Urine culture from last visit in May negative.  Never had CT scan.  Never saw a nurse practitioner.  Frequency is stable.  The patient's pain went away last time so she did not follow-up for get the x-ray.   In the last month she has higher volume urge incontinence with no cystitis symptoms.  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  The  episodes can be high-volume.     Clinically patient was not infected but I sent urine for culture.  She is has gained some weight and this may help explain her worsening urge incontinence.  The more I spoke to her she is reasonably happy on the oxybutynin.  I gave her the new beta 3 agonist with samples and co-pay card.  I hand-delivered oxybutynin.  If she fails the new medication she will go back on oxybutynin and see me in 1 year.  I mentioned 3 refractory therapies but she is not can pursue them at this time   Today On regular days she is basically continent on the Beecher unless she holds it too long.  She is very happy.  When she is sick with coughing sneezing she could use 5 pads a day.  No infections.  Does not want to pursue surgery since her father who is a diabetic is having issues with amputations and she is the primary caregiver  Today Grossly stable.  Mixed incontinence much better.  Is seeing physical therapy.  Unfortunate sure she is to pay for [facility] but no longer does.  Back on oxybutynin.  Clinically not infected  PMH: Past Medical History:  Diagnosis Date   Anxiety and depression    BRCA negative 10/2017   MyRisk neg except CDKN2A VUS; IBIS=9.3%   Family history of ovarian cancer    MyRisk neg  GERD (gastroesophageal reflux disease)    H/O nutritional disorder 08/01/2014   Headache, migraine 04/06/2014   Hemorrhoids    IBS (irritable bowel syndrome)    Leiomyoma of uterus    Lumbar radiculopathy 06/17/2014   Overview:  S/p bilateral lumbar decompression L4-5 and L5-S1 s/p excision of large central/left disc herniation 09/16/08 with resolution of symptoms recurrent in 2016 with dx of L5-S1 radiculopathy per Dr. Elmer Sow  [place], [facility]. Report scanned.    Menorrhagia    Stomach ulcer     Surgical History: Past Surgical History:  Procedure Laterality Date   ABDOMINAL HYSTERECTOMY     BACK SURGERY  2010   CESAREAN SECTION  2002   CHOLECYSTECTOMY  2010   COLONOSCOPY      ECTOPIC PREGNANCY SURGERY  2001, 2004   OOPHORECTOMY     TOTAL LAPAROSCOPIC HYSTERECTOMY WITH SALPINGECTOMY  2014   TUBAL LIGATION  2004    Home Medications:  Allergies as of 03/11/2022       Reactions   Latex Rash   Other Anaphylaxis   Uncoded Allergy. Allergen: DARVON Uncoded Allergy. Allergen: darvocet   Propoxyphene Anxiety, Other (See Comments)   Other reaction(s): Hallucination, Insomnia   Shellfish Allergy Shortness Of Breath   Poison Ivy Extract Dermatitis, Hives, Itching, Rash, Swelling   Poison Oak Extract Dermatitis, Hives, Itching, Rash, Swelling   Poison Sumac Extract Dermatitis, Hives, Itching, Rash, Swelling        Medication List        Accurate as of March 11, 2022 11:29 AM. If you have any questions, ask your nurse or doctor.          albuterol 108 (90 Base) MCG/ACT inhaler Commonly known as: VENTOLIN HFA Inhale into the lungs every 6 (six) hours as needed for wheezing or shortness of breath. Before bed.   Aqua-K 200 MCG/0.2ML Liqd Generic drug: Vitamin K Take by mouth.   cetirizine 10 MG tablet Commonly known as: ZYRTEC Take 10 mg by mouth daily.   escitalopram 5 MG tablet Commonly known as: LEXAPRO Take 5 mg by mouth daily.   escitalopram 10 MG tablet Commonly known as: LEXAPRO Take 10 mg by mouth daily.   estradiol 1 MG tablet Commonly known as: ESTRACE TAKE 1 TABLET BY MOUTH DAILY   ipratropium 0.03 % nasal spray Commonly known as: ATROVENT Place 2 sprays into both nostrils 2 (two) times daily.   meloxicam 15 MG tablet Commonly known as: MOBIC Take 15 mg by mouth daily.   montelukast 10 MG tablet Commonly known as: SINGULAIR Take 10 mg by mouth daily.   omeprazole 10 MG capsule Commonly known as: PRILOSEC Take 10 mg by mouth 2 (two) times daily.   oxybutynin 10 MG 24 hr tablet Commonly known as: DITROPAN-XL Take 1 tablet (10 mg total) by mouth daily.   SUMAtriptan 50 MG tablet Commonly known as:  IMITREX Reported on 08/18/2015   Symbicort 160-4.5 MCG/ACT inhaler Generic drug: budesonide-formoterol Inhale 2 puffs into the lungs 2 (two) times daily.   topiramate 50 MG tablet Commonly known as: TOPAMAX TAKE 1 TABLET (50 MG TOTAL) BY MOUTH ONCE DAILY.   TURMERIC PO Take by mouth.   Vitamin D3 50 MCG (2000 UT) capsule Take 2,000 Units by mouth daily. 2 tablets        Allergies:  Allergies  Allergen Reactions   Latex Rash   Other Anaphylaxis    Uncoded Allergy. Allergen: DARVON Uncoded Allergy. Allergen: darvocet   Propoxyphene Anxiety and Other (See  Comments)    Other reaction(s): Hallucination, Insomnia   Shellfish Allergy Shortness Of Breath   Poison Ivy Extract Dermatitis, Hives, Itching, Rash and Swelling   Poison Oak Extract Dermatitis, Hives, Itching, Rash and Swelling   Poison Sumac Extract Dermatitis, Hives, Itching, Rash and Swelling    Family History: Family History  Problem Relation Age of Onset   Kidney failure Mother    Ovarian cancer Mother 29       gene neg   Breast cancer Paternal Aunt        pat great aunt   Breast cancer Other    Bladder Cancer Neg Hx    Kidney cancer Neg Hx    Prostate cancer Neg Hx    BRCA 1/2 Neg Hx     Social History:  reports that she has quit smoking. She has never used smokeless tobacco. She reports current alcohol use. She reports that she does not use drugs.  ROS:                                        Physical Exam: BP 123/83   Pulse 68   Ht _0  (1.549 m)   Wt 111.1 kg   LMP 07/10/2012   BMI 46.29 kg/m   Constitutional:  Alert and oriented, No acute distress. HEENT: [place] AT, moist mucus membranes.  Trachea midline, no masses.   Laboratory Data: Lab Results  Component Value Date   WBC 8.7 01/25/2013   HGB 9.4 (L) 02/10/2013   HCT 37.7 01/25/2013   MCV 92 01/25/2013   PLT 332 01/25/2013    Lab Results  Component Value Date   CREATININE 0.73 01/25/2013    No results  found for: "PSA"  No results found for: "TESTOSTERONE"  Lab Results  Component Value Date   HGBA1C 5.1 09/04/2017    Urinalysis    Component Value Date/Time   APPEARANCEUR Clear 03/06/2020 1106   GLUCOSEU Negative 03/06/2020 1106   BILIRUBINUR Negative 03/06/2020 1106   PROTEINUR Negative 03/06/2020 1106   NITRITE Negative 03/06/2020 1106   LEUKOCYTESUR Negative 03/06/2020 1106    Pertinent Imaging:   Assessment & Plan: Oxybutynin renewed 30 x 11 I will see in 1 year  1. Mixed incontinence  - Urinalysis, Complete   No follow-ups on file.  Reece Packer, MD  [facility] [address], [place], [place] [address] [phone]

## 2022-03-13 ENCOUNTER — Ambulatory Visit: Payer: Commercial Managed Care - PPO

## 2022-03-13 DIAGNOSIS — M6289 Other specified disorders of muscle: Secondary | ICD-10-CM | POA: Diagnosis not present

## 2022-03-13 DIAGNOSIS — R278 Other lack of coordination: Secondary | ICD-10-CM

## 2022-03-13 DIAGNOSIS — M6281 Muscle weakness (generalized): Secondary | ICD-10-CM

## 2022-03-13 NOTE — Therapy (Signed)
OUTPATIENT PHYSICAL THERAPY FEMALE PELVIC DISCHARGE   Patient Name: Katrina Munoz MRN: [id number] DOB:04/23/1965, 56 y.o., female Today's Date: 03/13/2022   PT End of Session - 03/13/22 1441     Visit Number 13    Number of Visits 20    Date for PT Re-Evaluation 04/10/22    Authorization Type IE: 11/19/21; PN/RC: 01/30/22    PT Start Time 1445    PT Stop Time 1505    PT Time Calculation (min) 20 min    Activity Tolerance Patient tolerated treatment well             Past Medical History:  Diagnosis Date   Anxiety and depression    BRCA negative 10/2017   MyRisk neg except CDKN2A VUS; IBIS=9.3%   Family history of ovarian cancer    MyRisk neg   GERD (gastroesophageal reflux disease)    H/O nutritional disorder 08/01/2014   Headache, migraine 04/06/2014   Hemorrhoids    IBS (irritable bowel syndrome)    Leiomyoma of uterus    Lumbar radiculopathy 06/17/2014   Overview:  S/p bilateral lumbar decompression L4-5 and L5-S1 s/p excision of large central/left disc herniation 09/16/08 with resolution of symptoms recurrent in 2016 with dx of L5-S1 radiculopathy per Dr. Elmer Sow  [place], [facility] Report scanned.    Menorrhagia    Stomach ulcer    Past Surgical History:  Procedure Laterality Date   ABDOMINAL HYSTERECTOMY     BACK SURGERY  2010   CESAREAN SECTION  2002   CHOLECYSTECTOMY  2010   COLONOSCOPY     ECTOPIC PREGNANCY SURGERY  2001, 2004   OOPHORECTOMY     TOTAL LAPAROSCOPIC HYSTERECTOMY WITH SALPINGECTOMY  2014   TUBAL LIGATION  2004   Patient Active Problem List   Diagnosis Date Noted   Anxiety 12/11/2020   Nausea and vomiting in adult 01/03/2020   Right knee pain 12/13/2019   Poison ivy 11/02/2018   Peroneal tendinitis 03/02/2018   Acute left ankle pain 02/03/2018   Stable angina pectoris 08/26/2016   Gastroesophageal reflux disease without esophagitis 08/01/2016   Hyperglycemia 08/01/2016   Family history of ovarian cancer 07/10/2016   Incontinence  09/11/2015   SUI (stress urinary incontinence, female) 09/11/2015   S/P hysterectomy 08/02/2015   H/O nutritional disorder 08/01/2014   Chronic lumbar radiculopathy 06/17/2014   Chronic migraine 04/06/2014    PCP: Mychal Beckie Busing, PA-C  REFERRING PROVIDER: Chad Cordial, PA-C   REFERRING DIAG: N39.3 (ICD-10-CM) - SUI (stress urinary incontinence, female)   THERAPY DIAG:  Pelvic floor dysfunction  Muscle weakness (generalized)  Other lack of coordination  Rationale for Evaluation and Treatment: Rehabilitation  ONSET DATE: A few years  PRECAUTIONS: None  WEIGHT BEARING RESTRICTIONS: No  FALLS:  Has patient fallen in last 6 months? No  OCCUPATION/SOCIAL ACTIVITIES: Caregiver, substitute teacher, running errands    CHIEF CONCERN: Pt reports she feels her weight is contributing some but she has had problems controlling her bladder leading to urinary leakage. Pt has been taking Gentessa for OAB and was working well but insurance does not cover. Pt is now taking Oxybutin. Pt does not want to have surgery because she is the caregiver in her family. Pt has to wear incontinence briefs when she is sick (vomiting) or has increased coughing spells due to newly dx asthma.     PATIENT GOALS: Pt would like not to wear pads all the time or decrease the Lvl, Pt would like to be able to laugh/cough/sneeze without leakage    UROLOGICAL HISTORY Fluid intake: water, coffee   Pain with urination: No Fully empty bladder: No, some dribbling  Toileting posture: heels raised Stream: Strong Urgency: No Frequency: 7x Nocturia: 1x Leakage: Urge to void, Coughing, Sneezing, Lifting, Bending forward, and Intercourse Pads: Yes Type: Lvl 5 pads Poise Amount: 3x/day, wearing at night  Bladder control (0-10):  4/10   GASTROINTESTINAL HISTORY Pain with bowel movement: No Type of bowel movement:Type (Bristol Stool Scale) 3 and Strain Yes Frequency: 1x/day Fully empty rectum: Yes Leakage: No   SEXUAL HISTORY/FUNCTION Pain with intercourse: During Penetration and Pain Interrupts Intercourse Ability to have vaginal penetration:  Yes; Deep thrusting: Yes Able to achieve orgasm?: Yes  OBSTETRICAL HISTORY Vaginal deliveries: G5P3 Tearing: tearing with one  C-section deliveries: one  Currently pregnant: No  GYNECOLOGICAL HISTORY Hysterectomy: yes, abdominal  Pelvic Organ Prolapse: None Pain with exam: no Heaviness/pressure: no     SUBJECTIVE:  Pt has been doing well. Pt has seen pulmonology most recently and is now going to wear O2 at night.    PAIN:  Are you having pain? No NPRS: 6/10, B legs anteriorly but no N/T    OBJECTIVE:    COGNITION: Overall cognitive status: Within functional limits for tasks assessed     POSTURE:              Iliac crest height: L iliac crest, L shoulder elevated Pelvic obliquity: WNL   RANGE OF MOTION:    (Norm range in degrees)  LEFT 11/27/21 RIGHT 11/27/21  Lumbar forward flexion (65):  WNL    Lumbar extension (30): WNL    Lumbar lateral flexion (25):  WNL Restricted   Thoracic and Lumbar rotation (30 degrees):    WNL WNL  Hip Flexion (0-125):   WNL WNL  Hip IR (0-45):  WNL WNL  Hip ER (0-45):  WNL WNL  Hip Adduction:      Hip Abduction (0-40):  WNL WNL  Hip extension (0-15):     (*= pain, Blank rows = not tested)   STRENGTH: MMT    RLE 11/27/21 LLE 11/27/21  Hip Flexion 4 4  Hip Extension 5 5  Hip Abduction     Hip Adduction     Hip ER  4 4  Hip IR  5 5  Knee Extension 5 4  Knee Flexion 5 5  Dorsiflexion     Plantarflexion (seated) 5 5  (*= pain, Blank rows = not tested)   SPECIAL TESTS:   FABER (SN 81): negative B FADIR (SN 94): negative B   PALPATION:  Abdominal:  Diastasis: will assess next visit  Scar mobility:  present, c-section - increased scar restriction more  towards R>L with increasing "stabbing" sensation  -Center of abdomen and across upper quadrants with increased fascial tension but no discomfort    EXTERNAL PELVIC EXAM: Patient educated on the purpose of the pelvic exam and articulated understanding; patient consented to the exam verbally.  Breath coordination: present Voluntary Contraction: present, 3/5 MMT with Valsalva noted Relaxation: full Perineal movement with sustained IAP increase ("bear down"): descent Perineal movement with rapid IAP increase ("cough"): no change (0= no contraction, 1= flicker, 2= weak squeeze, 3= fair squeeze with lift, 4= good squeeze and lift against resistance, 5= strong squeeze against strong resistance)  TODAY'S TREATMENT  Neuromuscular Re-education: Review of goals below: Pt has met all 6 goals created below  Pt is content with progression in PFPT and will continue HEP   EXTERNAL PELVIC EXAM: Patient educated on the purpose of the pelvic exam and articulated understanding; patient consented to the exam verbally.  Breath coordination: present Voluntary Contraction: present, 3/5 MMT with 2 consecutive quick flicks  Relaxation: full     Patient Education:  Patient provided with HEP including: to continue with HEP. Patient verbalized understanding and returned demonstration. Patient will benefit from further education in order to maximize compliance and understanding for long-term therapeutic gains.     ASSESSMENT:  Clinical Impression: Patient presents to clinic with excellent motivation to participate in today's discharge session. Pt has demonstrated significant improvement since IE on 8/10 and then re-cert on 17/5/10. Pt has surpassed FOTO Urinary Problem goal reporting a 64. Pt has been able to use Lvl 3 incontinence pad changing only 1x per day mainly for hygiene. Pt reports using the incontinence pads more for "just in case" as Pt reports  having minimal to no urinary leakage. Pt has even been able to perform activities in the community and in the home without use of an incontinence pad. Pt continues to excel with HEP and is content with progression in PFPT. Pt is able to demonstrate 2 consecutive quick flicks with 3/5 MMT and will progress steadily with PFM strength activities with ultimate goal being 5 quick flicks. Pt verbalized and demonstrated understanding. Pt has met all goals below and is adequate for discharge.    Objective Impairments: decreased coordination, decreased endurance, decreased strength, increased fascial restrictions, improper body mechanics, postural dysfunction, and pain.   Activity Limitations: lifting, bending, standing, continence, toileting, locomotion level, and caring for others  Personal Factors: Age, Behavior pattern, Past/current experiences, Time since onset of injury/illness/exacerbation, and 1-2 comorbidities: asthma and diabetes  are also affecting patient's functional outcome.   Rehab Potential: Good  Clinical Decision Making: Evolving/moderate complexity  Evaluation Complexity: Moderate   GOALS: Goals reviewed with patient? Yes  SHORT TERM GOALS: Target date: 12/24/2021  Patient will demonstrate independence with HEP in order to maximize therapeutic gains and improve carryover from physical therapy sessions to ADLs in the home and community. Baseline: toileting posture handout; (11/8): Pt continues to be IND with HEP  Goal status: MET    LONG TERM GOALS: Target date: 04/10/2022   Patient will score  >/= 61 on FOTO Urinary Problem in order to demonstrate improved IAP management, improved PFM coordination, and overall QOL.  Baseline: 52; (10/25): 58; (11/8): 64 Goal status: MET  2.  Patient will report confidence in ability to control bladder > 7/10 in order to demonstrate improved function and ability to participate more fully in activities at home and in the community. Baseline:  4/10; (11/8): 7/10 Goal status: MET  3.  Patient will demonstrate  independent and coordinated diaphragmatic breathing in supine with a 1:2 breathing pattern for improved down-regulation of the nervous system and improved management of intra-abdominal pressures in order to increase function at home and in the community. Baseline: will assess next visit, (9/11): Valsalva maneuver with PFM strength test; (11/8): improved breathing in sitting and supine, will continue to work in standing (increase shoulder elevation); (12/20): significantly improved in sitting, standing, and supine  Goal status: MET  4.  Patient will report decreased reliance on protective undergarments as indicated by a 24 hour period to demonstrate improved bladder control and allow for increased participation in activities outside of the home. Baseline: Lvl 5 Poise at least 3x/day/incontinence briefs when sick; (10/25): Lvl 4 not all may be soaked/3x but more for hygiene and occasionally wet/has not been sick; (11/8): Lvl 4/3x but more damp; (12/20): Lvl 3 but has been able to wear no pantyliner and has had no urinary leakage  Goal status: MET  5.  Patient will report less than 5 incidents of stress urinary incontinence over the course of 3 weeks while coughing/sneezing/laughing/prolonged activity/bending in order to demonstrate improved PFM coordination, strength, and function for improved overall QOL. Baseline: urinary leakage with all the above; (11/8): bending no urinary leakage, coughing is better but with sneezing still having urinary leakage each time and laughing; (12/20): has had maybe 1 incident of SUI but very minimal urinary leakage  Goal status: MET   PLAN: PT Frequency: 1x/week  PT Duration: 10 weeks  Planned Interventions: Therapeutic exercises, Therapeutic activity, Neuromuscular re-education, Balance training, Gait training, Patient/Family education, Self Care, Joint mobilization, Spinal mobilization,  Cryotherapy, Moist heat, scar mobilization, Taping, and Manual therapy     Ayaan Ringle, PT, DPT  03/13/2022, 3:29 PM

## 2022-10-29 ENCOUNTER — Ambulatory Visit: Payer: Commercial Managed Care - PPO | Attending: Otolaryngology

## 2022-10-29 DIAGNOSIS — G4761 Periodic limb movement disorder: Secondary | ICD-10-CM | POA: Diagnosis not present

## 2022-10-29 DIAGNOSIS — G4736 Sleep related hypoventilation in conditions classified elsewhere: Secondary | ICD-10-CM | POA: Insufficient documentation

## 2022-10-29 DIAGNOSIS — G4733 Obstructive sleep apnea (adult) (pediatric): Secondary | ICD-10-CM | POA: Diagnosis present

## 2022-11-17 ENCOUNTER — Other Ambulatory Visit: Payer: Self-pay | Admitting: Obstetrics and Gynecology

## 2022-11-17 DIAGNOSIS — N951 Menopausal and female climacteric states: Secondary | ICD-10-CM

## 2022-11-17 DIAGNOSIS — Z79890 Hormone replacement therapy (postmenopausal): Secondary | ICD-10-CM

## 2022-11-24 HISTORY — PX: OTHER SURGICAL HISTORY: SHX169

## 2022-12-09 ENCOUNTER — Other Ambulatory Visit: Payer: Self-pay | Admitting: Obstetrics and Gynecology

## 2022-12-09 ENCOUNTER — Other Ambulatory Visit: Payer: Self-pay

## 2022-12-09 DIAGNOSIS — Z79890 Hormone replacement therapy (postmenopausal): Secondary | ICD-10-CM

## 2022-12-09 DIAGNOSIS — N951 Menopausal and female climacteric states: Secondary | ICD-10-CM

## 2022-12-09 MED ORDER — ESTRADIOL 1 MG PO TABS
ORAL_TABLET | ORAL | 0 refills | Status: AC
Start: 2022-12-09 — End: ?

## 2022-12-19 NOTE — Progress Notes (Signed)
PCP: Gardiner Coins, PA-C   Chief Complaint  Patient presents with   Gynecologic Exam    Vaginal itching that started today, no discharge or odor    HPI:      [name] is a 57 y.o. [id number] whose LMP was Patient's last menstrual period was 07/10/2012., presents today for her annual examination.  Her menses are absent due to [facility] due to [facility] 2014. She has been on estrace 1 mg daily since hyst with sx control. Last yr she started having hot flashes; is under increased stress, recently started anxiety meds with PCP  Sex activity: single partner, contraception - status post hysterectomy. She does not have vaginal dryness/bleeding; has occas dyspareunia.   Has had irritated area RT labia since this AM. Wears pads daily due to SUI. No meds to treat. No increased vag d/c, no recent abx use.   Pt with SUI, rarely urge incont. Did pelvic PT with some improvement, now on ditropan with urology. Still has small amt leakage.   Last Pap: 11/08/20  Results were: no abnormalities  Last mammogram: 12/26/21  Results were: normal with RT breast cysts--routine follow-up in 12 months There is no FH of breast cancer. There is a FH of ovarian cancer in her mother (mother is BRCA neg). Pt is MyRisk neg except CDKN2A VUS 2019. The patient does do self-breast exams.  Colonoscopy: 9/20 at [facility], hx of polyps; Repeat due after ?3-5 years.   Tobacco use: The patient denies current or previous tobacco use. Alcohol use: rare No drug use Exercise: min active  She does get adequate calcium and Vitamin D in her diet. Hx of Vit D deficiency, normal lab 2023.   Labs with PCP.   Patient Active Problem List   Diagnosis Date Noted   Anxiety 12/11/2020   Nausea and vomiting in adult 01/03/2020   Right knee pain 12/13/2019   Poison ivy 11/02/2018   Peroneal tendinitis 03/02/2018   Acute left ankle pain 02/03/2018   Stable angina pectoris 08/26/2016   Gastroesophageal reflux disease without  esophagitis 08/01/2016   Hyperglycemia 08/01/2016   Family history of ovarian cancer 07/10/2016   Incontinence 09/11/2015   SUI (stress urinary incontinence, female) 09/11/2015   S/P hysterectomy 08/02/2015   H/O nutritional disorder 08/01/2014   Chronic lumbar radiculopathy 06/17/2014   Chronic migraine 04/06/2014    Past Surgical History:  Procedure Laterality Date   ABDOMINAL HYSTERECTOMY     BACK SURGERY  2010   CESAREAN SECTION  2002   CHOLECYSTECTOMY  2010   COLONOSCOPY     ECTOPIC PREGNANCY SURGERY  2001, 2004   OOPHORECTOMY     OTHER SURGICAL HISTORY  11/2022   Left Eye Cataract   TOTAL LAPAROSCOPIC HYSTERECTOMY WITH SALPINGECTOMY  2014   TUBAL LIGATION  2004    Family History  Problem Relation Age of Onset   Kidney failure Mother    Ovarian cancer Mother 73       gene neg   Breast cancer Paternal Aunt        pat great aunt   Breast cancer Other    Bladder Cancer Neg Hx    Kidney cancer Neg Hx    Prostate cancer Neg Hx    BRCA 1/2 Neg Hx     Social History   Socioeconomic History   Marital status: Married    Spouse name: Not on file   Number of children: Not on file   Years of education: Not on  file   Highest education level: Not on file  Occupational History   Not on file  Tobacco Use   Smoking status: Former   Smokeless tobacco: Never  Vaping Use   Vaping status: Never Used  Substance and Sexual Activity   Alcohol use: Yes    Alcohol/week: 0.0 standard drinks of alcohol    Comment: occasionally   Drug use: No   Sexual activity: Yes    Birth control/protection: Surgical    Comment: Hysterectomy  Other Topics Concern   Not on file  Social History Narrative   Not on file   Social Determinants of Health   Financial Resource Strain: Low Risk  (11/19/2022)   Received from [facility]   Overall Financial Resource Strain (CARDIA)    Difficulty of Paying Living Expenses: Not hard at all  Food Insecurity: No Food Insecurity  (11/19/2022)   Received from [facility]   Hunger Vital Sign    Worried About Running Out of Food in the Last Year: Never true    Ran Out of Food in the Last Year: Never true  Recent Concern: Food Insecurity - Food Insecurity Present (08/22/2022)   Received from [facility]   Hunger Vital Sign    Worried About Running Out of Food in the Last Year: Sometimes true    Ran Out of Food in the Last Year: Never true  Transportation Needs: No Transportation Needs (11/19/2022)   Received from [facility] - Transportation    In the past 12 months, has lack of transportation kept you from medical appointments or from getting medications?: No    Lack of Transportation (Non-Medical): No  Physical Activity: Not on file  Stress: Not on file  Social Connections: Not on file  Intimate Partner Violence: Not on file     Current Outpatient Medications:    albuterol (VENTOLIN HFA) 108 (90 Base) MCG/ACT inhaler, Inhale into the lungs every 6 (six) hours as needed for wheezing or shortness of breath. Before bed., Disp: , Rfl:    cetirizine (ZYRTEC) 10 MG tablet, Take 10 mg by mouth daily., Disp: , Rfl:    Cholecalciferol (VITAMIN D3) 2000 units capsule, Take 2,000 Units by mouth. 4,000 daily., Disp: , Rfl:    escitalopram (LEXAPRO) 5 MG tablet, Take 5 mg by mouth daily., Disp: , Rfl:    fluconazole (DIFLUCAN) 150 MG tablet, Take 1 tablet (150 mg total) by mouth once for 1 dose., Disp: 1 tablet, Rfl: 0   ipratropium (ATROVENT) 0.03 % nasal spray, Place 2 sprays into both nostrils 2 (two) times daily., Disp: , Rfl:    montelukast (SINGULAIR) 10 MG tablet, Take 10 mg by mouth daily., Disp: , Rfl:    omeprazole (PRILOSEC) 10 MG capsule, Take 20 mg by mouth 2 (two) times daily., Disp: , Rfl:    oxybutynin (DITROPAN-XL) 10 MG 24 hr tablet, Take 1 tablet (10 mg total) by mouth daily., Disp: 30 tablet, Rfl: 11   SUMAtriptan (IMITREX) 50 MG tablet,  Reported on 08/18/2015, Disp: , Rfl:    SYMBICORT 160-4.5 MCG/ACT inhaler, Inhale 2 puffs into the lungs 2 (two) times daily., Disp: , Rfl:    topiramate (TOPAMAX) 50 MG tablet, TAKE 1 TABLET (50 MG TOTAL) BY MOUTH ONCE DAILY., Disp: , Rfl: 4   TURMERIC PO, Take by mouth., Disp: , Rfl:    Vitamin K (AQUA-K) 200 MCG/0.2ML LIQD, Take by mouth., Disp: , Rfl:  estradiol (ESTRACE) 1 MG tablet, TAKE 1 TABLET BY MOUTH DAILY, Disp: 30 tablet, Rfl: 12   modafinil (PROVIGIL) 100 MG tablet, Take by mouth. (Patient not taking: Reported on 12/23/2022), Disp: , Rfl:      ROS:  Review of Systems  Constitutional:  Positive for fatigue. Negative for fever and unexpected weight change.  Respiratory:  Negative for cough, shortness of breath and wheezing.   Cardiovascular:  Negative for chest pain, palpitations and leg swelling.  Gastrointestinal:  Positive for constipation. Negative for blood in stool, diarrhea, nausea and vomiting.  Endocrine: Negative for cold intolerance, heat intolerance and polyuria.  Genitourinary:  Positive for frequency. Negative for dyspareunia, dysuria, flank pain, genital sores, hematuria, menstrual problem, pelvic pain, urgency, vaginal bleeding, vaginal discharge and vaginal pain.  Musculoskeletal:  Negative for back pain, joint swelling and myalgias.  Skin:  Negative for rash.  Neurological:  Negative for dizziness, syncope, light-headedness, numbness and headaches.  Hematological:  Negative for adenopathy.  Psychiatric/Behavioral:  Positive for agitation and dysphoric mood. Negative for confusion, sleep disturbance and suicidal ideas. The patient is not nervous/anxious.    BREAST: No symptoms    Objective: BP 130/86   Ht 5\' 1"  (1.549 m)   Wt 247 lb (112 kg)   LMP 07/10/2012   BMI 46.67 kg/m    Physical Exam Constitutional:      Appearance: She is well-developed.  Genitourinary:     Vulva normal.     Genitourinary Comments: UTERUS/CX SURG REM     Right Labia:  rash.     Right Labia: No tenderness or lesions.    Left Labia: rash.     Left Labia: No tenderness or lesions.       Vaginal cuff intact.    No vaginal discharge, erythema or tenderness.      Right Adnexa: not tender and no mass present.    Left Adnexa: not tender and no mass present.    Cervix is absent.     Uterus is absent.  Breasts:    Right: No mass, nipple discharge, skin change or tenderness.     Left: No mass, nipple discharge, skin change or tenderness.  Neck:     Thyroid: No thyromegaly.  Cardiovascular:     Rate and Rhythm: Normal rate and regular rhythm.     Heart sounds: Normal heart sounds. No murmur heard. Pulmonary:     Effort: Pulmonary effort is normal.     Breath sounds: Normal breath sounds.  Abdominal:     Palpations: Abdomen is soft.     Tenderness: There is no abdominal tenderness. There is no guarding.  Musculoskeletal:        General: Normal range of motion.     Cervical back: Normal range of motion.  Neurological:     General: No focal deficit present.     Mental Status: She is alert and oriented to person, place, and time.     Cranial Nerves: No cranial nerve deficit.  Skin:    General: Skin is warm and dry.  Psychiatric:        Mood and Affect: Mood normal.        Behavior: Behavior normal.        Thought Content: Thought content normal.        Judgment: Judgment normal.  Vitals reviewed.    Results for orders placed or performed in visit on 12/23/22 (from the past 24 hour(s))  POCT Wet Prep with KOH     Status:  Normal   Collection Time: 12/23/22  4:46 PM  Result Value Ref Range   Trichomonas, UA Negative    Clue Cells Wet Prep HPF POC neg    Epithelial Wet Prep HPF POC     Yeast Wet Prep HPF POC neg    Bacteria Wet Prep HPF POC     RBC Wet Prep HPF POC     WBC Wet Prep HPF POC     KOH Prep POC Negative Negative     Assessment/Plan:  Encounter for annual routine gynecological examination  Encounter for screening mammogram  for malignant neoplasm of breast - Plan: MM 3D SCREENING MAMMOGRAM BILATERAL BREAST; pt to schedule mammo  Vasomotor symptoms due to menopause - Plan: estradiol (ESTRACE) 1 MG tablet; doing pretty well, Rx RF. F/u prn.   Hormone replacement therapy (HRT) - Plan: estradiol (ESTRACE) 1 MG tablet  Vaginal itching - Plan: POCT Wet Prep with KOH, fluconazole (DIFLUCAN) 150 MG tablet; pos sx and exam, net wet prep. Treat empirically for yeast vag. Rx diflucan. F/u prn.    Meds ordered this encounter  Medications   fluconazole (DIFLUCAN) 150 MG tablet    Sig: Take 1 tablet (150 mg total) by mouth once for 1 dose.    Dispense:  1 tablet    Refill:  0    Order Specific Question:   Supervising Provider    Answer:   Hildred Laser [[id number]]   estradiol (ESTRACE) 1 MG tablet    Sig: TAKE 1 TABLET BY MOUTH DAILY    Dispense:  30 tablet    Refill:  12    Order Specific Question:   Supervising Provider    Answer:   Waymon Budge            GYN counsel breast self exam, mammography screening, menopause, adequate intake of calcium and vitamin D, diet and exercise    F/U  Return in about 1 year (around 12/23/2023).  Tallon Gertz B. Zeniah Briney, PA-C 12/23/2022 4:47 PM

## 2022-12-23 ENCOUNTER — Ambulatory Visit: Payer: Commercial Managed Care - PPO | Admitting: Obstetrics and Gynecology

## 2022-12-23 ENCOUNTER — Encounter: Payer: Self-pay | Admitting: Obstetrics and Gynecology

## 2022-12-23 VITALS — BP 130/86 | Ht 61.0 in | Wt 247.0 lb

## 2022-12-23 DIAGNOSIS — N898 Other specified noninflammatory disorders of vagina: Secondary | ICD-10-CM | POA: Diagnosis not present

## 2022-12-23 DIAGNOSIS — Z1231 Encounter for screening mammogram for malignant neoplasm of breast: Secondary | ICD-10-CM

## 2022-12-23 DIAGNOSIS — Z1211 Encounter for screening for malignant neoplasm of colon: Secondary | ICD-10-CM

## 2022-12-23 DIAGNOSIS — Z01411 Encounter for gynecological examination (general) (routine) with abnormal findings: Secondary | ICD-10-CM

## 2022-12-23 DIAGNOSIS — N951 Menopausal and female climacteric states: Secondary | ICD-10-CM

## 2022-12-23 DIAGNOSIS — Z79890 Hormone replacement therapy (postmenopausal): Secondary | ICD-10-CM

## 2022-12-23 DIAGNOSIS — N393 Stress incontinence (female) (male): Secondary | ICD-10-CM

## 2022-12-23 DIAGNOSIS — Z01419 Encounter for gynecological examination (general) (routine) without abnormal findings: Secondary | ICD-10-CM

## 2022-12-23 LAB — POCT WET PREP WITH KOH
Clue Cells Wet Prep HPF POC: NEGATIVE
KOH Prep POC: NEGATIVE
Trichomonas, UA: NEGATIVE
Yeast Wet Prep HPF POC: NEGATIVE

## 2022-12-23 MED ORDER — ESTRADIOL 1 MG PO TABS
ORAL_TABLET | ORAL | 12 refills | Status: DC
Start: 2022-12-23 — End: 2024-01-27

## 2022-12-23 MED ORDER — FLUCONAZOLE 150 MG PO TABS: 150.0000 mg | ORAL_TABLET | Freq: Once | ORAL | 0 refills | Status: AC

## 2022-12-23 NOTE — Patient Instructions (Addendum)
I value your feedback and you entrusting us with your care. If you get a [facility] patient survey, I would appreciate you taking the time to let us know about your experience today. Thank you!  [facility] ([place])--[phone]  

## 2023-01-16 ENCOUNTER — Ambulatory Visit
Admission: RE | Admit: 2023-01-16 | Discharge: 2023-01-16 | Disposition: A | Discharge status: Home / Self Care | Payer: Commercial Managed Care - PPO | Source: Ambulatory Visit | Attending: Obstetrics and Gynecology | Admitting: Obstetrics and Gynecology

## 2023-01-16 DIAGNOSIS — Z1231 Encounter for screening mammogram for malignant neoplasm of breast: Secondary | ICD-10-CM | POA: Insufficient documentation

## 2023-01-20 ENCOUNTER — Other Ambulatory Visit: Payer: Self-pay | Admitting: Obstetrics and Gynecology

## 2023-01-20 DIAGNOSIS — R928 Other abnormal and inconclusive findings on diagnostic imaging of breast: Secondary | ICD-10-CM

## 2023-01-30 ENCOUNTER — Ambulatory Visit
Admission: RE | Admit: 2023-01-30 | Discharge: 2023-01-30 | Disposition: A | Discharge status: Home / Self Care | Payer: Commercial Managed Care - PPO | Source: Ambulatory Visit | Attending: Obstetrics and Gynecology | Admitting: Obstetrics and Gynecology

## 2023-01-30 ENCOUNTER — Encounter: Payer: Self-pay | Admitting: Obstetrics and Gynecology

## 2023-01-30 DIAGNOSIS — R928 Other abnormal and inconclusive findings on diagnostic imaging of breast: Secondary | ICD-10-CM | POA: Diagnosis present

## 2023-03-10 ENCOUNTER — Ambulatory Visit: Payer: Commercial Managed Care - PPO | Admitting: Urology

## 2023-03-24 ENCOUNTER — Ambulatory Visit: Payer: Commercial Managed Care - PPO | Admitting: Urology

## 2023-03-24 ENCOUNTER — Encounter: Payer: Self-pay | Admitting: Urology

## 2023-03-24 VITALS — BP 133/80 | HR 74 | Ht 61.0 in | Wt 247.0 lb

## 2023-03-24 DIAGNOSIS — N3946 Mixed incontinence: Secondary | ICD-10-CM

## 2023-03-24 MED ORDER — OXYBUTYNIN CHLORIDE ER 10 MG PO TB24: 10.0000 mg | ORAL_TABLET | Freq: Every day | ORAL | 11 refills | Status: DC

## 2023-03-24 NOTE — Progress Notes (Signed)
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 She leaks with coughing and sneezing and sometimes bending and lifting. There is no question she can leak without awareness. She also has urgency incontinence and denies enuresis and is failed Kegel exercises and Miraberon. She really started Vesicare in its helping some. She wears 3-4 pads a day sometimes damp sometimes moderately wet   The last time the patient was here she had a grade 2 hypermobility of the bladder neck and a positive cough test. She had a grade 1 rectocele.    On urodynamics the patient had a reduced bladder capacity with bladder overactivity and a mild bladder outlet abnormality   Felt that she had a moderate overactive bladder with a mild to moderate outlet abnormality. Felt both were significant. I gave her oxybutynin last time. We then discussed the sling in detail with my usual template.   The patient is almost dry. Sometimes she leaks without awareness. If she gets a bad cold she will have stress incontinence. She truly thinks he oxybutynin is helped a lot with less urgency and frequency.    Continues to be very happy with the oxybutynin once a day.  Still has residual stress incontinence and does not wish to have surgery.  She is jogging a lot now and very happy.   Urine culture from last visit in May negative.  Never had CT scan.  Never saw a nurse practitioner.  Frequency is stable.  The patient's pain went away last time so she did not follow-up for get the x-ray.   In the last month she has higher volume urge incontinence with no cystitis symptoms.  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  The  episodes can be high-volume.     Clinically patient was not infected but I sent urine for culture.  She is has gained some weight and this may help explain her worsening urge incontinence.  The more I spoke to her she is reasonably happy on the oxybutynin.  I gave her the new beta 3 agonist with samples and co-pay card.  I hand-delivered oxybutynin.  If she fails the new medication she will go back on oxybutynin and see me in 1 year.  I mentioned 3 refractory therapies but she is not can pursue them at this time   Today On regular days she is basically continent on the [place] unless she holds it too long.  She is very happy.  When she is sick with coughing sneezing she could use 5 pads a day.  No infections.  Does not want to pursue surgery since her father who is a diabetic is having issues with amputations and she is the primary caregiver   Today Frequency stable.  Mixed incontinence persisting.  She is already seeing physical therapy.  Again insurance would not cover the [place] which worked the best.  Clinically not infected and I will see her in 1 year.  Disease she had her sixth grandchild   PMH: Past Medical History:  Diagnosis Date   Anxiety and depression    BRCA negative 10/2017   MyRisk neg except CDKN2A VUS; IBIS=9.3%  Family history of ovarian cancer    MyRisk neg   GERD (gastroesophageal reflux disease)    H/O nutritional disorder 08/01/2014   Headache, migraine 04/06/2014   Hemorrhoids    IBS (irritable bowel syndrome)    Leiomyoma of uterus    Lumbar radiculopathy 06/17/2014   Overview:  S/p bilateral lumbar decompression L4-5 and L5-S1 s/p excision of large central/left disc herniation 09/16/08 with resolution of symptoms recurrent in 2016 with dx of L5-S1 radiculopathy per Dr. Orlie Pollen  [place]. Report scanned.    Menorrhagia    Stomach ulcer     Surgical History: Past Surgical History:  Procedure Laterality Date   ABDOMINAL HYSTERECTOMY     BACK SURGERY  2010    CESAREAN SECTION  2002   CHOLECYSTECTOMY  2010   COLONOSCOPY     ECTOPIC PREGNANCY SURGERY  2001, 2004   OOPHORECTOMY     OTHER SURGICAL HISTORY  11/2022   Left Eye Cataract   TOTAL LAPAROSCOPIC HYSTERECTOMY WITH SALPINGECTOMY  2014   TUBAL LIGATION  2004    Home Medications:  Allergies as of 03/24/2023       Reactions   Latex Rash   Other Anaphylaxis   Uncoded Allergy. Allergen: DARVON Uncoded Allergy. Allergen: darvocet   Propoxyphene Anxiety, Other (See Comments)   Other reaction(s): Hallucination, Insomnia   Shellfish Allergy Shortness Of Breath   Poison Ivy Extract Dermatitis, Hives, Itching, Rash, Swelling   Poison Oak Extract Dermatitis, Hives, Itching, Rash, Swelling   Poison Sumac Extract Dermatitis, Hives, Itching, Rash, Swelling        Medication List        Accurate as of March 24, 2023 10:31 AM. If you have any questions, ask your nurse or doctor.          albuterol 108 (90 Base) MCG/ACT inhaler Commonly known as: VENTOLIN HFA Inhale into the lungs every 6 (six) hours as needed for wheezing or shortness of breath. Before bed.   Aqua-K 200 MCG/0.2ML Liqd Generic drug: Vitamin K Take by mouth.   cetirizine 10 MG tablet Commonly known as: ZYRTEC Take 10 mg by mouth daily.   escitalopram 5 MG tablet Commonly known as: LEXAPRO Take 5 mg by mouth daily.   estradiol 1 MG tablet Commonly known as: ESTRACE TAKE 1 TABLET BY MOUTH DAILY   ipratropium 0.03 % nasal spray Commonly known as: ATROVENT Place 2 sprays into both nostrils 2 (two) times daily.   modafinil 100 MG tablet Commonly known as: PROVIGIL Take by mouth.   montelukast 10 MG tablet Commonly known as: SINGULAIR Take 10 mg by mouth daily.   omeprazole 10 MG capsule Commonly known as: PRILOSEC Take 20 mg by mouth daily.   oxybutynin 10 MG 24 hr tablet Commonly known as: DITROPAN-XL Take 1 tablet (10 mg total) by mouth daily.   SUMAtriptan 50 MG tablet Commonly known as:  IMITREX Reported on 08/18/2015   Symbicort 160-4.5 MCG/ACT inhaler Generic drug: budesonide-formoterol Inhale 2 puffs into the lungs 2 (two) times daily.   topiramate 50 MG tablet Commonly known as: TOPAMAX TAKE 1 TABLET (50 MG TOTAL) BY MOUTH ONCE DAILY.   TURMERIC PO Take by mouth.   Vitamin D3 50 MCG (2000 UT) capsule Take 2,000 Units by mouth. 4,000 daily.        Allergies:  Allergies  Allergen Reactions   Latex Rash   Other Anaphylaxis    Uncoded Allergy. Allergen: DARVON Uncoded Allergy. Allergen: darvocet   Propoxyphene Anxiety and Other (  See Comments)    Other reaction(s): Hallucination, Insomnia   Shellfish Allergy Shortness Of Breath   Poison Ivy Extract Dermatitis, Hives, Itching, Rash and Swelling   Poison Oak Extract Dermatitis, Hives, Itching, Rash and Swelling   Poison Sumac Extract Dermatitis, Hives, Itching, Rash and Swelling    Family History: Family History  Problem Relation Age of Onset   Kidney failure Mother    Ovarian cancer Mother 3       gene neg   Breast cancer Paternal Aunt        pat great aunt   Breast cancer Other    Bladder Cancer Neg Hx    Kidney cancer Neg Hx    Prostate cancer Neg Hx    BRCA 1/2 Neg Hx     Social History:  reports that she has quit smoking. She has never used smokeless tobacco. She reports current alcohol use. She reports that she does not use drugs.  ROS:                                        Physical Exam: BP 133/80   Pulse 74   Ht 5\' 1"  (1.549 m)   Wt 112 kg   LMP 07/10/2012   BMI 46.67 kg/m   Constitutional:  Alert and oriented, No acute distress. HEENT: [place] AT, moist mucus membranes.  Trachea midline, no masses.   Laboratory Data: Lab Results  Component Value Date   WBC 8.7 01/25/2013   HGB 9.4 (L) 02/10/2013   HCT 37.7 01/25/2013   MCV 92 01/25/2013   PLT 332 01/25/2013    Lab Results  Component Value Date   CREATININE 0.73 01/25/2013    No results found  for: "PSA"  No results found for: "TESTOSTERONE"  Lab Results  Component Value Date   HGBA1C 5.1 09/04/2017    Urinalysis    Component Value Date/Time   APPEARANCEUR Clear 03/11/2022 1118   GLUCOSEU Negative 03/11/2022 1118   BILIRUBINUR Negative 03/11/2022 1118   PROTEINUR Negative 03/11/2022 1118   NITRITE Negative 03/11/2022 1118   LEUKOCYTESUR Negative 03/11/2022 1118    Pertinent Imaging:   Assessment & Plan: Renew 30 x 11 oxybutynin and I will see in a year  1. Mixed incontinence (Primary)  - Urinalysis, Complete   No follow-ups on file.  Martina Sinner, MD  [facility] [address], [address] [place], [place] [address] [phone]

## 2023-03-25 LAB — MICROSCOPIC EXAMINATION

## 2023-03-25 LAB — URINALYSIS, COMPLETE
Bilirubin, UA: NEGATIVE
Glucose, UA: NEGATIVE
Ketones, UA: NEGATIVE
Leukocytes,UA: NEGATIVE
Nitrite, UA: NEGATIVE
Protein,UA: NEGATIVE
Specific Gravity, UA: 1.015 (ref 1.005–1.030)
Urobilinogen, Ur: 0.2 mg/dL (ref 0.2–1.0)
pH, UA: 7 (ref 5.0–7.5)

## 2023-06-23 IMAGING — MG MM DIGITAL SCREENING BILAT W/ TOMO AND CAD
6 of 10 series · 6 of 30 positions shown · non-contrast
Comparison: Previous exam(s).

CLINICAL DATA: Screening.

EXAM:
DIGITAL SCREENING BILATERAL MAMMOGRAM WITH TOMOSYNTHESIS AND CAD
TECHNIQUE: Bilateral screening digital craniocaudal and mediolateral oblique
mammograms were obtained. Bilateral screening digital breast
tomosynthesis was performed. The images were evaluated with
computer-aided detection.

[L MLO synth-2D (1 of 2)]
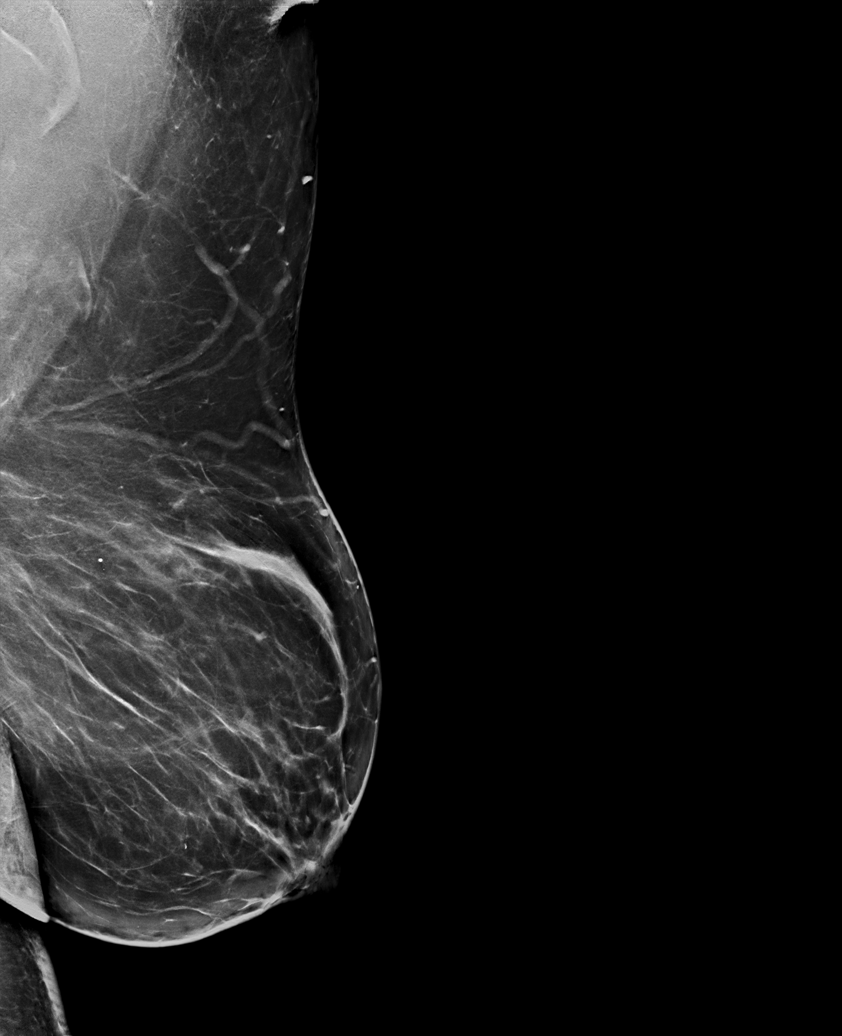

[L MLO synth-2D (2 of 2)]
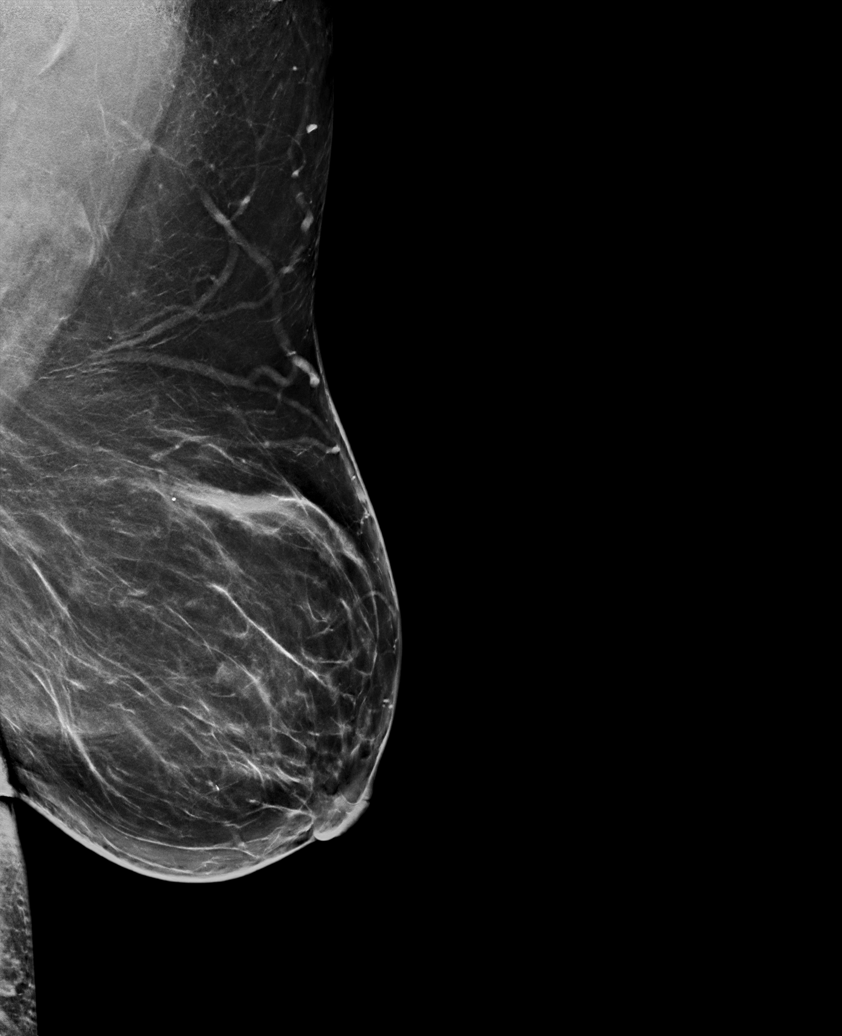

[L CC synth-2D]
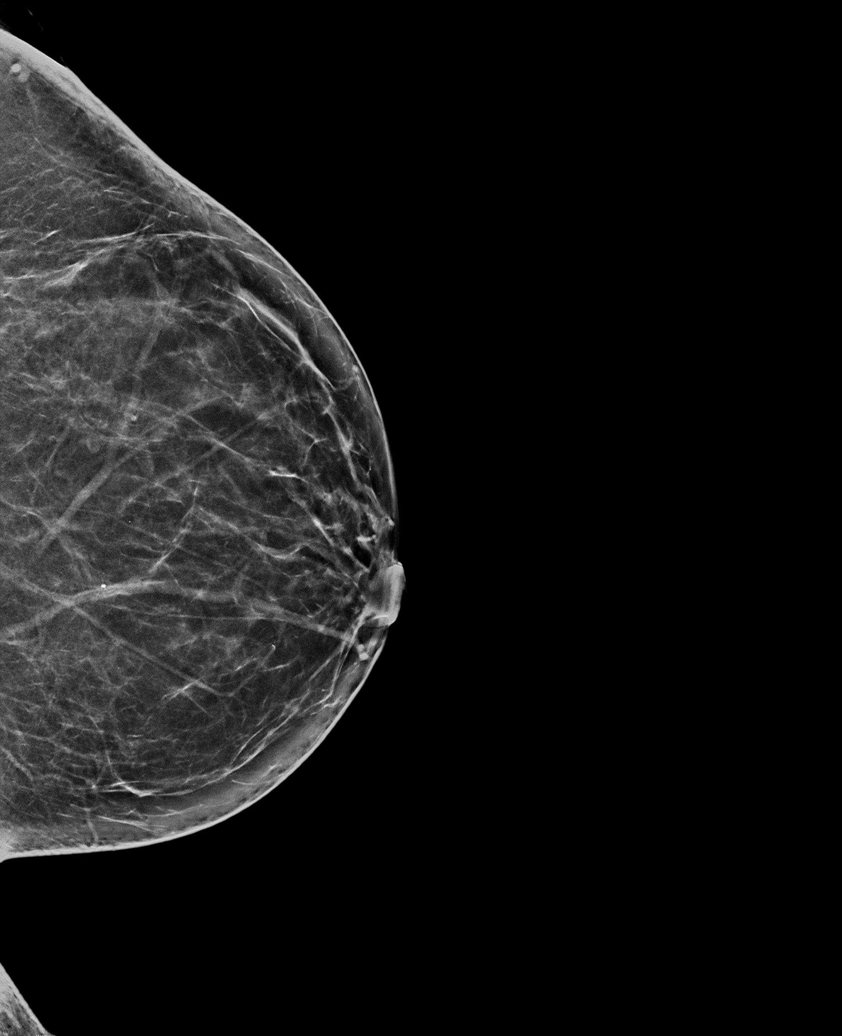

[R CC synth-2D]
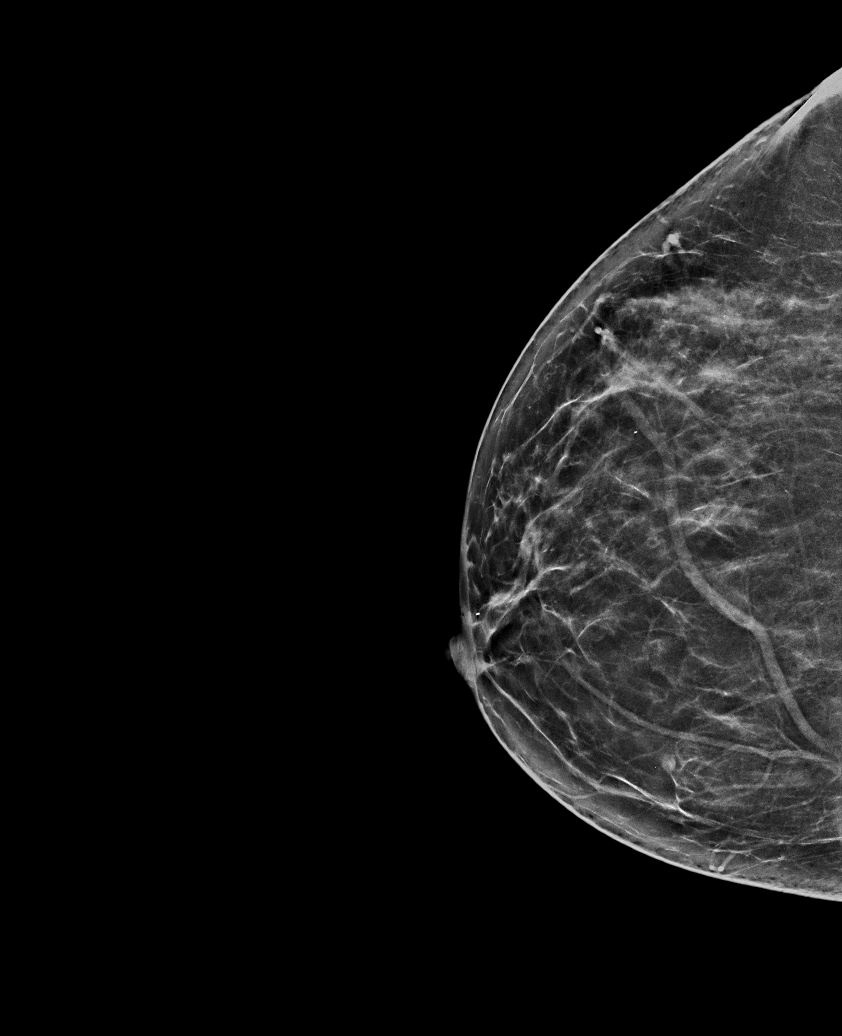

[R MLO synth-2D]
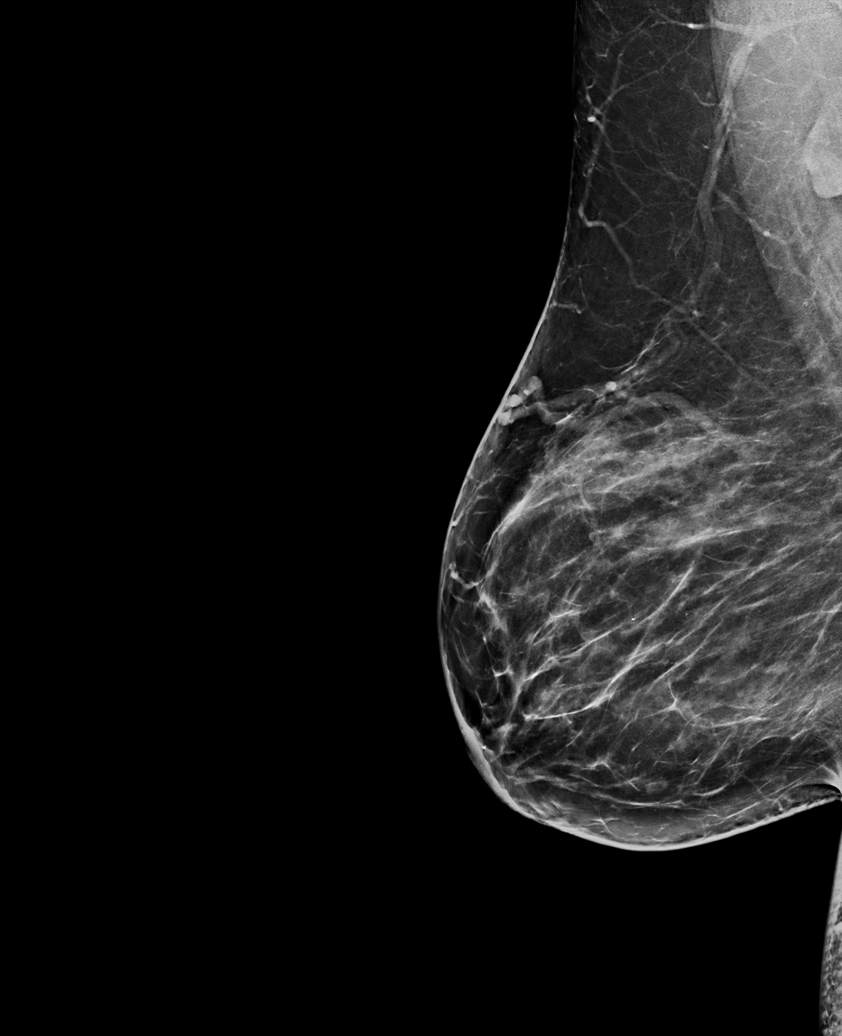

[L MLO tomo · tomo slice 46/91.0]
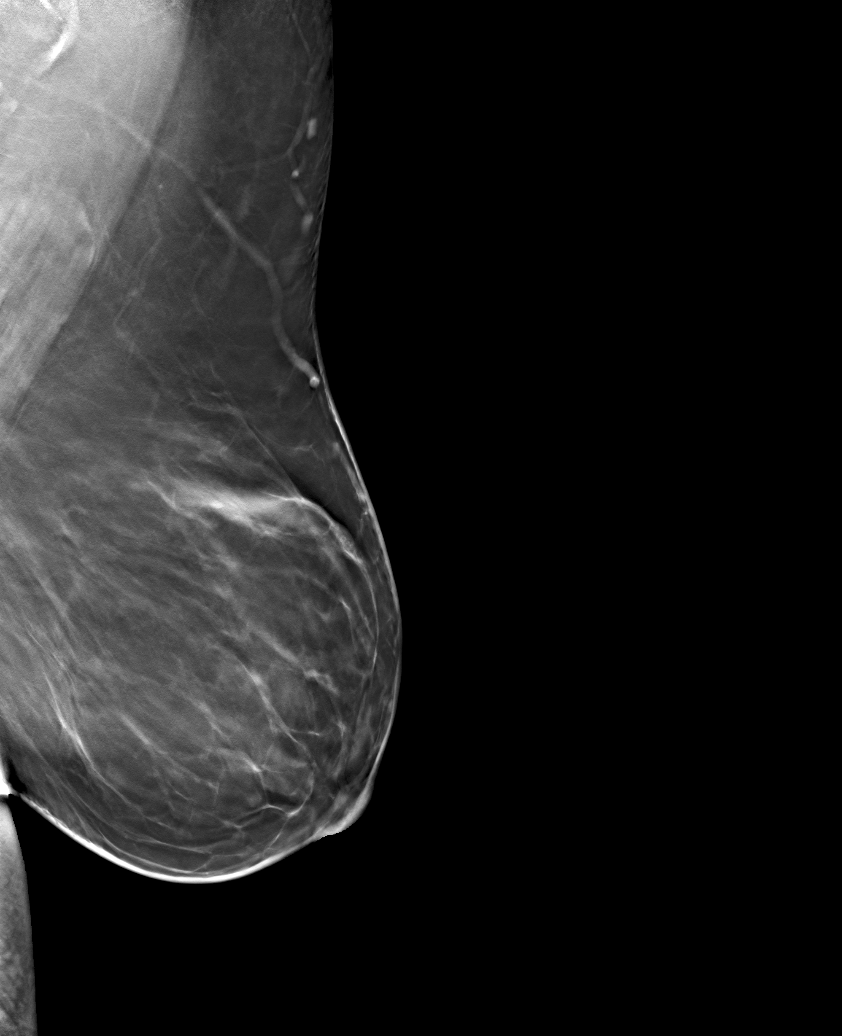

[6 of 30 positions shown; findings below may reference images not displayed]

ACR Breast Density Category b: There are scattered areas of
fibroglandular density.
FINDINGS: There are no findings suspicious for malignancy.
IMPRESSION: No mammographic evidence of malignancy. A result letter of this
screening mammogram will be mailed directly to the patient.

RECOMMENDATION:
Screening mammogram in one year. (Code:51-O-LD2)

BI-RADS CATEGORY  1: Negative.

## 2023-08-22 HISTORY — PX: CARPAL TUNNEL RELEASE: SHX101

## 2023-12-15 ENCOUNTER — Ambulatory Visit: Admitting: Certified Registered"

## 2023-12-15 ENCOUNTER — Ambulatory Visit
Admission: RE | Admit: 2023-12-15 | Discharge: 2023-12-15 | Disposition: A | Discharge status: Home / Self Care | Attending: Gastroenterology | Admitting: Gastroenterology

## 2023-12-15 ENCOUNTER — Encounter: Payer: Self-pay | Admitting: Gastroenterology

## 2023-12-15 ENCOUNTER — Encounter
Admission: RE | Disposition: A | Discharge status: Home / Self Care | Payer: Self-pay | Source: Home / Self Care | Attending: Gastroenterology

## 2023-12-15 DIAGNOSIS — F32A Depression, unspecified: Secondary | ICD-10-CM | POA: Insufficient documentation

## 2023-12-15 DIAGNOSIS — R1310 Dysphagia, unspecified: Secondary | ICD-10-CM | POA: Diagnosis present

## 2023-12-15 DIAGNOSIS — K219 Gastro-esophageal reflux disease without esophagitis: Secondary | ICD-10-CM | POA: Diagnosis not present

## 2023-12-15 DIAGNOSIS — Z9049 Acquired absence of other specified parts of digestive tract: Secondary | ICD-10-CM | POA: Insufficient documentation

## 2023-12-15 DIAGNOSIS — Z6839 Body mass index (BMI) 39.0-39.9, adult: Secondary | ICD-10-CM | POA: Insufficient documentation

## 2023-12-15 DIAGNOSIS — F419 Anxiety disorder, unspecified: Secondary | ICD-10-CM | POA: Diagnosis not present

## 2023-12-15 DIAGNOSIS — Z8711 Personal history of peptic ulcer disease: Secondary | ICD-10-CM | POA: Insufficient documentation

## 2023-12-15 DIAGNOSIS — E669 Obesity, unspecified: Secondary | ICD-10-CM | POA: Insufficient documentation

## 2023-12-15 DIAGNOSIS — I2089 Other forms of angina pectoris: Secondary | ICD-10-CM | POA: Insufficient documentation

## 2023-12-15 HISTORY — PX: ESOPHAGOGASTRODUODENOSCOPY: SHX5428

## 2023-12-15 SURGERY — EGD (ESOPHAGOGASTRODUODENOSCOPY)
Anesthesia: General

## 2023-12-15 MED ORDER — SODIUM CHLORIDE 0.9 % IV SOLN
INTRAVENOUS | Status: DC
  Administered 2023-12-15: 20 mL/h via INTRAVENOUS

## 2023-12-15 MED ORDER — PROPOFOL 500 MG/50ML IV EMUL
INTRAVENOUS | Status: DC | PRN
  Administered 2023-12-15: 120 ug/kg/min via INTRAVENOUS

## 2023-12-15 MED ORDER — LIDOCAINE 2% (20 MG/ML) 5 ML SYRINGE
INTRAMUSCULAR | Status: DC | PRN
  Administered 2023-12-15: 20 mg via INTRAVENOUS

## 2023-12-15 MED ORDER — PROPOFOL 10 MG/ML IV BOLUS
INTRAVENOUS | Status: DC | PRN
  Administered 2023-12-15: 100 mg via INTRAVENOUS

## 2023-12-15 MED ORDER — GLYCOPYRROLATE 0.2 MG/ML IJ SOLN
INTRAMUSCULAR | Status: DC | PRN
  Administered 2023-12-15: .2 mg via INTRAVENOUS

## 2023-12-15 NOTE — Op Note (Signed)
 [facility] Patient Name: Katrina Munoz Procedure Date: 12/15/2023 9:29 AM MRN: [id number] Account #: [id number] Date of Birth: [birth year] Admit Type: Outpatient Age: 58 Room: [facility] ENDO ROOM 3 Gender: Female Note Status: Finalized Instrument Name: Barnie GI Scope [id number] Procedure:             Upper GI endoscopy Indications:           Dysphagia, Gastro-esophageal reflux disease Providers:             Ole Schick MD, MD Medicines:             Monitored Anesthesia Care Complications:         No immediate complications. Estimated blood loss:                         Minimal. Procedure:             Pre-Anesthesia Assessment:                        - Prior to the procedure, a History and Physical was                         performed, and patient medications and allergies were                         reviewed. The patient is competent. The risks and                         benefits of the procedure and the sedation options and                         risks were discussed with the patient. All questions                         were answered and informed consent was obtained.                         Patient identification and proposed procedure were                         verified by the physician, the nurse, the                         anesthesiologist, the anesthetist and the technician                         in the endoscopy suite. Mental Status Examination:                         alert and oriented. Airway Examination: normal                         oropharyngeal airway and neck mobility. Respiratory                         Examination: clear to auscultation. CV Examination:                         normal. Prophylactic Antibiotics: The patient does not  require prophylactic antibiotics. Prior                         Anticoagulants: The patient has taken no anticoagulant                         or antiplatelet agents. ASA  Grade Assessment: III - A                         patient with severe systemic disease. After reviewing                         the risks and benefits, the patient was deemed in                         satisfactory condition to undergo the procedure. The                         anesthesia plan was to use monitored anesthesia care                         (MAC). Immediately prior to administration of                         medications, the patient was re-assessed for adequacy                         to receive sedatives. The heart rate, respiratory                         rate, oxygen saturations, blood pressure, adequacy of                         pulmonary ventilation, and response to care were                         monitored throughout the procedure. The physical                         status of the patient was re-assessed after the                         procedure.                        After obtaining informed consent, the endoscope was                         passed under direct vision. Throughout the procedure,                         the patient's blood pressure, pulse, and oxygen                         saturations were monitored continuously. The Endoscope                         was introduced through the mouth, and advanced to the  second part of duodenum. The upper GI endoscopy was                         accomplished without difficulty. The patient tolerated                         the procedure well. 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      No endoscopic abnormality was evident in the esophagus to explain the       patient's complaint of dysphagia. Biopsies were obtained from the       proximal and distal esophagus with cold forceps for histology of       suspected eosinophilic esophagitis. Estimated blood loss was minimal.      The entire examined stomach was normal.       The examined duodenum was normal. Impression:            - Ectopic gastric mucosa in the upper third of the                         esophagus.                        - No endoscopic esophageal abnormality to explain                         patient's dysphagia.                        - Normal stomach.                        - Normal examined duodenum.                        - Biopsies were taken with a cold forceps for                         evaluation of eosinophilic esophagitis. Recommendation:        - Discharge patient to home.                        - Resume previous diet.                        - Continue present medications.                        - Await pathology results.                        - Return to referring physician as previously                         scheduled. 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                         Can f/u as an outpatient to discuss more. Procedure Code(s):     --- Professional ---  56760, Esophagogastroduodenoscopy, flexible,                         transoral; with biopsy, single or multiple Diagnosis Code(s):     --- Professional ---                        Q40.2, Other specified congenital malformations of                         stomach                        R13.10, Dysphagia, unspecified                        K21.9, Gastro-esophageal reflux disease without                         esophagitis CPT copyright 2022 [employer]. All rights reserved. The codes documented in this report are preliminary and upon coder review may  be revised to meet current compliance requirements. Ole Schick MD, MD 12/15/2023 10:06:51 AM Number of Addenda: 0 Note Initiated On: 12/15/2023 9:29 AM Estimated Blood Loss:  Estimated blood loss was minimal.      [facility]

## 2023-12-15 NOTE — H&P (Signed)
 Outpatient short stay form Pre-procedure 12/15/2023  Katrina Munoz Schick, MD  Primary Physician: Katrina Constance Sor, PA-C  Reason for visit:  GERD/Dysphagia  History of present illness:    [name], obesity, [name]    Current Facility-Administered Medications:    0.9 %  sodium chloride  infusion, , Intravenous, Continuous, Katrina Munoz, [name], MD, Last Rate: 20 mL/hr at 12/15/23 0921, 20 mL/hr at 12/15/23 9078  Medications Prior to Admission  Medication Sig Dispense Refill Last Dose/Taking   albuterol (VENTOLIN HFA) 108 (90 Base) MCG/ACT inhaler Inhale into the lungs every 6 (six) hours as needed for wheezing or shortness of breath. Before bed.   12/14/2023   cetirizine (ZYRTEC) 10 MG tablet Take 10 mg by mouth daily.   12/14/2023   Cholecalciferol (VITAMIN D3) 2000 units capsule Take 2,000 Units by mouth. 4,000 daily.   12/14/2023   escitalopram (LEXAPRO) 5 MG tablet Take 5 mg by mouth daily.   12/14/2023   estradiol  (ESTRACE ) 1 MG tablet TAKE 1 TABLET BY MOUTH DAILY 30 tablet 12 12/14/2023   ipratropium (ATROVENT ) 0.03 % nasal spray Place 2 sprays into both nostrils 2 (two) times daily.   12/14/2023   montelukast (SINGULAIR) 10 MG tablet Take 10 mg by mouth daily.   12/14/2023   omeprazole (PRILOSEC) 10 MG capsule Take 20 mg by mouth daily.   12/14/2023   oxybutynin  (DITROPAN -XL) 10 MG 24 hr tablet Take 1 tablet (10 mg total) by mouth daily. 30 tablet 11 12/14/2023   SUMAtriptan (IMITREX) 50 MG tablet Reported on 08/18/2015   12/14/2023   SYMBICORT  160-4.5 MCG/ACT inhaler Inhale 2 puffs into the lungs 2 (two) times daily.   12/14/2023   topiramate (TOPAMAX) 50 MG tablet TAKE 1 TABLET (50 MG TOTAL) BY MOUTH ONCE DAILY.  4 12/14/2023   TURMERIC PO Take by mouth.   Past Week   Vitamin K (AQUA-K) 200  MCG/0.2ML LIQD Take by mouth.   12/14/2023   modafinil (PROVIGIL) 100 MG tablet Take by mouth.        Allergies  Allergen Reactions   Latex Rash   Other Anaphylaxis    Uncoded Allergy. Allergen: DARVON Uncoded Allergy. Allergen: darvocet   Propoxyphene Anxiety and Other (See Comments)    Other reaction(s): Hallucination, Insomnia   Shellfish Allergy Shortness Of Breath   Poison Ivy Extract Dermatitis, Hives, Itching, Rash and Swelling   Poison Oak Extract Dermatitis, Hives, Itching, Rash and Swelling   Poison Sumac Extract Dermatitis, Hives, Itching, Rash and Swelling     Past Medical History:  Diagnosis Date   Anxiety and depression    BRCA negative 10/2017   MyRisk neg except CDKN2A VUS; IBIS=9.3%   Family history of ovarian cancer    MyRisk neg   GERD (gastroesophageal reflux disease)    H/O nutritional disorder 08/01/2014   Headache, migraine 04/06/2014   Hemorrhoids    IBS (irritable bowel syndrome)    Leiomyoma of uterus    Lumbar radiculopathy 06/17/2014   Overview:  S/p bilateral lumbar decompression L4-5 and L5-S1 s/p excision of large central/left disc herniation 09/16/08 with resolution of symptoms recurrent in 2016 with dx of L5-S1 radiculopathy per Dr. ONEIDA Banks  [place], [facility]. Report scanned.    Menorrhagia    Stomach ulcer     Review of systems:  Otherwise negative.  Physical Exam  Gen: Alert, oriented. Appears stated age.  HEENT: PERRLA. Lungs: No respiratory distress CV: RRR Abd: soft, benign, no masses Ext: No edema    Planned procedures: Proceed with EGD. The patient understands the nature of the planned procedure, indications, risks, alternatives and potential complications including but not limited to bleeding, infection, perforation, damage to internal organs and possible oversedation/side effects from anesthesia. The patient agrees and gives consent to proceed.  Please refer to procedure notes for findings, recommendations and patient  disposition/instructions.     Katrina Munoz Schick, MD [facility]

## 2023-12-15 NOTE — Anesthesia Preprocedure Evaluation (Signed)
 Anesthesia Evaluation  Patient identified by MRN, date of birth, ID band Patient awake    Reviewed: Allergy & Precautions, H&P , NPO status , Patient's Chart, lab work & pertinent test results, reviewed documented beta blocker date and time   Airway Mallampati: II   Neck ROM: full    Dental  (+) Poor Dentition   Pulmonary neg pulmonary ROS, former smoker   Pulmonary exam normal        Cardiovascular Exercise Tolerance: Good + angina with exertion Normal cardiovascular exam Rhythm:regular Rate:Normal     Neuro/Psych  Headaches PSYCHIATRIC DISORDERS Anxiety Depression     Neuromuscular disease    GI/Hepatic Neg liver ROS, PUD,GERD  Medicated,,  Endo/Other  negative endocrine ROS    Renal/GU negative Renal ROS  negative genitourinary   Musculoskeletal   Abdominal   Peds  Hematology negative hematology ROS (+)   Anesthesia Other Findings Past Medical History: No date: Anxiety and depression 10/2017: BRCA negative     Comment:  MyRisk neg except CDKN2A VUS; IBIS=9.3% No date: Family history of ovarian cancer     Comment:  MyRisk neg No date: GERD (gastroesophageal reflux disease) 08/01/2014: H/O nutritional disorder 04/06/2014: Headache, migraine No date: Hemorrhoids No date: IBS (irritable bowel syndrome) No date: Leiomyoma of uterus 06/17/2014: Lumbar radiculopathy     Comment:  Overview:  S/p bilateral lumbar decompression L4-5 and               L5-S1 s/p excision of large central/left disc herniation               09/16/08 with resolution of symptoms recurrent in 2016               with dx of L5-S1 radiculopathy per Dr. ONEIDA Banks  [place],               [facility]. Report scanned.  No date: Menorrhagia No date: Stomach ulcer Past Surgical History: No date: ABDOMINAL HYSTERECTOMY 2010: BACK SURGERY 2002: CESAREAN SECTION 2010: CHOLECYSTECTOMY No date: COLONOSCOPY 2001, 2004: ECTOPIC PREGNANCY SURGERY No date:  OOPHORECTOMY 11/2022: OTHER SURGICAL HISTORY     Comment:  Left Eye Cataract 2014: TOTAL LAPAROSCOPIC HYSTERECTOMY WITH SALPINGECTOMY 2004: TUBAL LIGATION BMI    Body Mass Index: 39.94 kg/m     Reproductive/Obstetrics negative OB ROS                              Anesthesia Physical Anesthesia Plan  ASA: 3  Anesthesia Plan: General   Post-op Pain Management:    Induction:   PONV Risk Score and Plan:   Airway Management Planned:   Additional Equipment:   Intra-op Plan:   Post-operative Plan:   Informed Consent: I have reviewed the patients History and Physical, chart, labs and discussed the procedure including the risks, benefits and alternatives for the proposed anesthesia with the patient or authorized representative who has indicated his/her understanding and acceptance.     Dental Advisory Given  Plan Discussed with: CRNA  Anesthesia Plan Comments:         Anesthesia Quick Evaluation

## 2023-12-15 NOTE — Transfer of Care (Signed)
 Immediate Anesthesia Transfer of Care Note  Patient: Katrina Munoz  Procedure(s) Performed: EGD (ESOPHAGOGASTRODUODENOSCOPY)  Patient Location: Endoscopy Unit  Anesthesia Type:General  Level of Consciousness: awake and drowsy  Airway & Oxygen Therapy: Patient Spontanous Breathing  Post-op Assessment: Report given to RN and Post -op Vital signs reviewed and stable  Post vital signs: Reviewed  Last Vitals:  Vitals Value Taken Time  BP 110/73   Temp    Pulse 63 12/15/23 10:04  Resp 15 12/15/23 10:04  SpO2 95 % 12/15/23 10:04  Vitals shown include unfiled device data.  Last Pain:  Vitals:   12/15/23 0907  TempSrc: Temporal  PainSc: 0-No pain         Complications: No notable events documented.

## 2023-12-15 NOTE — Anesthesia Postprocedure Evaluation (Signed)
 Anesthesia Post Note  Patient: Katrina Munoz  Procedure(s) Performed: EGD (ESOPHAGOGASTRODUODENOSCOPY)  Patient location during evaluation: PACU Anesthesia Type: General Level of consciousness: awake and alert Pain management: pain level controlled Vital Signs Assessment: post-procedure vital signs reviewed and stable Respiratory status: spontaneous breathing, nonlabored ventilation, respiratory function stable and patient connected to nasal cannula oxygen Cardiovascular status: blood pressure returned to baseline and stable Postop Assessment: no apparent nausea or vomiting Anesthetic complications: no   No notable events documented.   Last Vitals:  Vitals:   12/15/23 1014 12/15/23 1024  BP: 112/66 116/70  Pulse: 63 65  Resp: 15 17  Temp:    SpO2: 97% 95%    Last Pain:  Vitals:   12/15/23 1024  TempSrc:   PainSc: 0-No pain                 Lynwood KANDICE Clause

## 2023-12-15 NOTE — Interval H&P Note (Signed)
 History and Physical Interval Note:  12/15/2023 9:46 AM  Katrina Munoz  has presented today for surgery, with the diagnosis of GERD.  The various methods of treatment have been discussed with the patient and family. After consideration of risks, benefits and other options for treatment, the patient has consented to  Procedure(s): EGD (ESOPHAGOGASTRODUODENOSCOPY) (N/A) as a surgical intervention.  The patient's history has been reviewed, patient examined, no change in status, stable for surgery.  I have reviewed the patient's chart and labs.  Questions were answered to the patient's satisfaction.     Katrina Munoz  Ok to proceed with EGD

## 2023-12-16 LAB — SURGICAL PATHOLOGY

## 2024-01-06 ENCOUNTER — Other Ambulatory Visit: Payer: Self-pay | Admitting: Physician Assistant

## 2024-01-06 DIAGNOSIS — Z1231 Encounter for screening mammogram for malignant neoplasm of breast: Secondary | ICD-10-CM

## 2024-01-09 ENCOUNTER — Other Ambulatory Visit: Payer: Self-pay | Admitting: Obstetrics and Gynecology

## 2024-01-09 DIAGNOSIS — N951 Menopausal and female climacteric states: Secondary | ICD-10-CM

## 2024-01-09 DIAGNOSIS — Z79890 Hormone replacement therapy (postmenopausal): Secondary | ICD-10-CM

## 2024-01-26 NOTE — Progress Notes (Unsigned)
 PCP: Perri Constance Sor, PA-C   No chief complaint on file.   HPI:      [name] is a 58 y.o. [id number] whose LMP was Patient's last menstrual period was 07/10/2012., presents today for her annual examination.  Her menses are absent due to [facility] due to leio 2014. She has been on estrace  1 mg daily since hyst with sx control. Last yr she started having hot flashes; is under increased stress, recently started anxiety meds with PCP  Sex activity: single partner, contraception - status post hysterectomy. She does not have vaginal dryness/bleeding; has occas dyspareunia.   Has had irritated area RT labia since this AM. Wears pads daily due to SUI. No meds to treat. No increased vag d/c, no recent abx use.   Pt with SUI, rarely urge incont. Did pelvic PT with some improvement, now on ditropan  with urology. Still has small amt leakage.   Last Pap: 11/08/20  Results were: no abnormalities; no longer indicated.   Last mammogram: 01/30/23  Results were: normal with RT breast cysts--routine follow-up in 12 months; has appt 11/25 There is no FH of breast cancer. There is a FH of ovarian cancer in her mother (mother is BRCA neg). Pt is MyRisk neg except CDKN2A VUS 2019. The patient does do self-breast exams.  Colonoscopy: 9/20 at [facility], hx of polyps; Repeat due after ?3-5 years.   Tobacco use: The patient denies current or previous tobacco use. Alcohol use: rare No drug use Exercise: min active  She does get adequate calcium and Vitamin D  in her diet. Hx of Vit D deficiency, normal lab 2023.   Labs with PCP.   Patient Active Problem List   Diagnosis Date Noted   Anxiety 12/11/2020   Nausea and vomiting in adult 01/03/2020   Right knee pain 12/13/2019   Poison ivy 11/02/2018   Peroneal tendinitis 03/02/2018   Acute left ankle pain 02/03/2018   Stable angina pectoris 08/26/2016   Gastroesophageal reflux disease without esophagitis 08/01/2016   Hyperglycemia 08/01/2016    Family history of ovarian cancer 07/10/2016   Incontinence 09/11/2015   SUI (stress urinary incontinence, female) 09/11/2015   S/P hysterectomy 08/02/2015   H/O nutritional disorder 08/01/2014   Chronic lumbar radiculopathy 06/17/2014   Chronic migraine 04/06/2014    Past Surgical History:  Procedure Laterality Date   ABDOMINAL HYSTERECTOMY     BACK SURGERY  2010   CESAREAN SECTION  2002   CHOLECYSTECTOMY  2010   COLONOSCOPY     ECTOPIC PREGNANCY SURGERY  2001, 2004   ESOPHAGOGASTRODUODENOSCOPY N/A 12/15/2023   Procedure: EGD (ESOPHAGOGASTRODUODENOSCOPY);  Surgeon: Maryruth Ole DASEN, MD;  Location: [facility] ENDOSCOPY;  Service: Endoscopy;  Laterality: N/A;   OOPHORECTOMY     OTHER SURGICAL HISTORY  11/2022   Left Eye Cataract   TOTAL LAPAROSCOPIC HYSTERECTOMY WITH SALPINGECTOMY  2014   TUBAL LIGATION  2004    Family History  Problem Relation Age of Onset   Kidney failure Mother    Ovarian cancer Mother 48       gene neg   Breast cancer Paternal Aunt        pat great aunt   Breast cancer Other    Bladder Cancer Neg Hx    Kidney cancer Neg Hx    Prostate cancer Neg Hx    BRCA 1/2 Neg Hx     Social History   Socioeconomic History   Marital status: Married    Spouse name: Not on file  Number of children: Not on file   Years of education: Not on file   Highest education level: Not on file  Occupational History   Not on file  Tobacco Use   Smoking status: Former   Smokeless tobacco: Never  Vaping Use   Vaping status: Never Used  Substance and Sexual Activity   Alcohol use: Yes    Alcohol/week: 0.0 standard drinks of alcohol    Comment: occasionally   Drug use: No   Sexual activity: Yes    Birth control/protection: Surgical    Comment: Hysterectomy  Other Topics Concern   Not on file  Social History Narrative   Not on file   Social Drivers of Health   Financial Resource Strain: Low Risk  (10/15/2023)   Received from [facility]   Overall  Financial Resource Strain (CARDIA)    Difficulty of Paying Living Expenses: Not hard at all  Food Insecurity: No Food Insecurity (10/15/2023)   Received from [facility]   Hunger Vital Sign    Within the past 12 months, you worried that your food would run out before you got the money to buy more.: Never true    Within the past 12 months, the food you bought just didn't last and you didn't have money to get more.: Never true  Transportation Needs: No Transportation Needs (10/15/2023)   Received from [facility] - Transportation    In the past 12 months, has lack of transportation kept you from medical appointments or from getting medications?: No    Lack of Transportation (Non-Medical): No  Physical Activity: Not on file  Stress: Not on file  Social Connections: Not on file  Intimate Partner Violence: Not on file     Current Outpatient Medications:    albuterol (VENTOLIN HFA) 108 (90 Base) MCG/ACT inhaler, Inhale into the lungs every 6 (six) hours as needed for wheezing or shortness of breath. Before bed., Disp: , Rfl:    cetirizine (ZYRTEC) 10 MG tablet, Take 10 mg by mouth daily., Disp: , Rfl:    Cholecalciferol (VITAMIN D3) 2000 units capsule, Take 2,000 Units by mouth. 4,000 daily., Disp: , Rfl:    escitalopram (LEXAPRO) 5 MG tablet, Take 5 mg by mouth daily., Disp: , Rfl:    estradiol  (ESTRACE ) 1 MG tablet, TAKE 1 TABLET BY MOUTH DAILY, Disp: 30 tablet, Rfl: 12   ipratropium (ATROVENT ) 0.03 % nasal spray, Place 2 sprays into both nostrils 2 (two) times daily., Disp: , Rfl:    modafinil (PROVIGIL) 100 MG tablet, Take by mouth., Disp: , Rfl:    montelukast (SINGULAIR) 10 MG tablet, Take 10 mg by mouth daily., Disp: , Rfl:    omeprazole (PRILOSEC) 10 MG capsule, Take 20 mg by mouth daily., Disp: , Rfl:    oxybutynin  (DITROPAN -XL) 10 MG 24 hr tablet, Take 1 tablet (10 mg total) by mouth daily., Disp: 30 tablet, Rfl: 11   SUMAtriptan (IMITREX)  50 MG tablet, Reported on 08/18/2015, Disp: , Rfl:    SYMBICORT  160-4.5 MCG/ACT inhaler, Inhale 2 puffs into the lungs 2 (two) times daily., Disp: , Rfl:    topiramate (TOPAMAX) 50 MG tablet, TAKE 1 TABLET (50 MG TOTAL) BY MOUTH ONCE DAILY., Disp: , Rfl: 4   TURMERIC PO, Take by mouth., Disp: , Rfl:    Vitamin K (AQUA-K) 200 MCG/0.2ML LIQD, Take by mouth., Disp: , Rfl:      ROS:  Review of Systems  Constitutional:  Positive for fatigue. Negative for fever and unexpected weight change.  Respiratory:  Negative for cough, shortness of breath and wheezing.   Cardiovascular:  Negative for chest pain, palpitations and leg swelling.  Gastrointestinal:  Positive for constipation. Negative for blood in stool, diarrhea, nausea and vomiting.  Endocrine: Negative for cold intolerance, heat intolerance and polyuria.  Genitourinary:  Positive for frequency. Negative for dyspareunia, dysuria, flank pain, genital sores, hematuria, menstrual problem, pelvic pain, urgency, vaginal bleeding, vaginal discharge and vaginal pain.  Musculoskeletal:  Negative for back pain, joint swelling and myalgias.  Skin:  Negative for rash.  Neurological:  Negative for dizziness, syncope, light-headedness, numbness and headaches.  Hematological:  Negative for adenopathy.  Psychiatric/Behavioral:  Positive for agitation and dysphoric mood. Negative for confusion, sleep disturbance and suicidal ideas. The patient is not nervous/anxious.    BREAST: No symptoms    Objective: LMP 07/10/2012    Physical Exam Constitutional:      Appearance: She is well-developed.  Genitourinary:     Vulva normal.     Genitourinary Comments: UTERUS/CX SURG REM     Right Labia: rash.     Right Labia: No tenderness or lesions.    Left Labia: rash.     Left Labia: No tenderness or lesions.    Vaginal cuff intact.    No vaginal discharge, erythema or tenderness.      Right Adnexa: not tender and no mass present.    Left Adnexa: not  tender and no mass present.    Cervix is absent.     Uterus is absent.  Breasts:    Right: No mass, nipple discharge, skin change or tenderness.     Left: No mass, nipple discharge, skin change or tenderness.  Neck:     Thyroid: No thyromegaly.  Cardiovascular:     Rate and Rhythm: Normal rate and regular rhythm.     Heart sounds: Normal heart sounds. No murmur heard. Pulmonary:     Effort: Pulmonary effort is normal.     Breath sounds: Normal breath sounds.  Abdominal:     Palpations: Abdomen is soft.     Tenderness: There is no abdominal tenderness. There is no guarding.  Musculoskeletal:        General: Normal range of motion.     Cervical back: Normal range of motion.  Neurological:     General: No focal deficit present.     Mental Status: She is alert and oriented to person, place, and time.     Cranial Nerves: No cranial nerve deficit.  Skin:    General: Skin is warm and dry.  Psychiatric:        Mood and Affect: Mood normal.        Behavior: Behavior normal.        Thought Content: Thought content normal.        Judgment: Judgment normal.  Vitals reviewed.    No results found for this or any previous visit (from the past 24 hours).    Assessment/Plan:  Encounter for annual routine gynecological examination  Encounter for screening mammogram for malignant neoplasm of breast - Plan: MM 3D SCREENING MAMMOGRAM BILATERAL BREAST; pt to schedule mammo  Vasomotor symptoms due to menopause - Plan: estradiol  (ESTRACE ) 1 MG tablet; doing pretty well, Rx RF. F/u prn.   Hormone replacement therapy (HRT) - Plan: estradiol  (ESTRACE ) 1 MG tablet  Vaginal itching - Plan: POCT Wet Prep with KOH, fluconazole  (DIFLUCAN ) 150 MG tablet; pos sx and exam, net  wet prep. Treat empirically for yeast vag. Rx diflucan . F/u prn.    No orders of the defined types were placed in this encounter.           GYN counsel breast self exam, mammography screening, menopause, adequate intake of  calcium and vitamin D , diet and exercise    F/U  No follow-ups on file.  Eilam Shrewsbury B. Ailea Rhatigan, PA-C 01/26/2024 3:12 PM

## 2024-01-27 ENCOUNTER — Ambulatory Visit (INDEPENDENT_AMBULATORY_CARE_PROVIDER_SITE_OTHER): Admitting: Obstetrics and Gynecology

## 2024-01-27 ENCOUNTER — Encounter: Payer: Self-pay | Admitting: Obstetrics and Gynecology

## 2024-01-27 VITALS — BP 105/70 | HR 64 | Ht 61.0 in | Wt 208.0 lb

## 2024-01-27 DIAGNOSIS — Z90710 Acquired absence of both cervix and uterus: Secondary | ICD-10-CM | POA: Diagnosis not present

## 2024-01-27 DIAGNOSIS — Z79890 Hormone replacement therapy (postmenopausal): Secondary | ICD-10-CM

## 2024-01-27 DIAGNOSIS — N951 Menopausal and female climacteric states: Secondary | ICD-10-CM | POA: Diagnosis not present

## 2024-01-27 DIAGNOSIS — Z01419 Encounter for gynecological examination (general) (routine) without abnormal findings: Secondary | ICD-10-CM | POA: Diagnosis not present

## 2024-01-27 DIAGNOSIS — Z1231 Encounter for screening mammogram for malignant neoplasm of breast: Secondary | ICD-10-CM

## 2024-01-27 DIAGNOSIS — Z1211 Encounter for screening for malignant neoplasm of colon: Secondary | ICD-10-CM

## 2024-01-27 DIAGNOSIS — K5901 Slow transit constipation: Secondary | ICD-10-CM

## 2024-01-27 MED ORDER — ESTRADIOL 0.5 MG PO TABS
0.5000 mg | ORAL_TABLET | Freq: Every day | ORAL | 3 refills | Status: AC
Start: 2024-01-27 — End: ?

## 2024-01-27 NOTE — Patient Instructions (Signed)
 I value your feedback and you entrusting [place] with your care. If you get a [place] patient survey, I would appreciate you taking the time to let us know about your experience today. Thank you! ? ? ?

## 2024-01-30 ENCOUNTER — Encounter: Payer: Self-pay | Admitting: Emergency Medicine

## 2024-01-30 ENCOUNTER — Ambulatory Visit
Admission: EM | Admit: 2024-01-30 | Discharge: 2024-01-30 | Disposition: A | Discharge status: Home / Self Care | Attending: Physician Assistant | Admitting: Physician Assistant

## 2024-01-30 DIAGNOSIS — H6501 Acute serous otitis media, right ear: Secondary | ICD-10-CM

## 2024-01-30 DIAGNOSIS — K1120 Sialoadenitis, unspecified: Secondary | ICD-10-CM | POA: Diagnosis not present

## 2024-01-30 MED ORDER — AMOXICILLIN-POT CLAVULANATE 875-125 MG PO TABS: 1.0000 | ORAL_TABLET | Freq: Two times a day (BID) | ORAL | 0 refills | Status: AC

## 2024-01-30 NOTE — Discharge Instructions (Addendum)
-   We discussed multiple different possibilities causing her pain.  Highest likelihood would be parotid gland inflammation which could be related to a stone or infection. - I sent antibiotics to the pharmacy to treat possible infection.  You also have a lot of fluid behind your ear so you should be using Flonase and a decongestant. - Take ibuprofen to help reduce inflammation and apply warm compresses to the face and jaw frequently throughout the day. - We also discussed sucking on hard sour candies to stimulate salivary flow and move a stone that could be lodged. - If at any point you develop fever, acute worsening of swelling or pain or have not improved in the next couple days need to seek reevaluation.  For significant worsening please go to the ER.

## 2024-01-30 NOTE — ED Provider Notes (Signed)
 [facility]    CSN: [id number] Arrival date & time: 01/30/24  1456      History   Chief Complaint Chief Complaint  Patient presents with   Facial Pain    HPI Katrina Munoz is a 58 y.o. female presenting for swelling of the right side of her face and jaw since yesterday that worsened today.  Patient reports symptoms started about 15 minutes after she was eating.  Unsure if it is related.  She denies dental pain.  Increased pain when she chews.  No sore throat or painful swallowing.  Reports right sided headache and pain in the ear.  Has had some nasal congestion over the past few weeks.  Seen by PCP a couple weeks ago and diagnosed with a viral infection.  No longer having a cough.  Denies fever.  Has taken over-the-counter ibuprofen.  No other complaints.  HPI  Past Medical History:  Diagnosis Date   Anxiety and depression    BRCA negative 10/2017   MyRisk neg except CDKN2A VUS; IBIS=9.3%   Family history of ovarian cancer    MyRisk neg   GERD (gastroesophageal reflux disease)    H/O nutritional disorder 08/01/2014   Headache, migraine 04/06/2014   Hemorrhoids    IBS (irritable bowel syndrome)    Leiomyoma of uterus    Lumbar radiculopathy 06/17/2014   Overview:  S/p bilateral lumbar decompression L4-5 and L5-S1 s/p excision of large central/left disc herniation 09/16/08 with resolution of symptoms recurrent in 2016 with dx of L5-S1 radiculopathy per Dr. ONEIDA Banks  [place], [facility]. Report scanned.    Menorrhagia    Stomach ulcer     Patient Active Problem List   Diagnosis Date Noted   Anxiety 12/11/2020   Nausea and vomiting in adult 01/03/2020   Right knee pain 12/13/2019   Poison ivy 11/02/2018   Peroneal tendinitis 03/02/2018   Acute left ankle pain 02/03/2018   Stable angina pectoris 08/26/2016   Gastroesophageal reflux disease without esophagitis 08/01/2016   Hyperglycemia 08/01/2016   Family history of ovarian cancer 07/10/2016   Incontinence  09/11/2015   SUI (stress urinary incontinence, female) 09/11/2015   S/P hysterectomy 08/02/2015   H/O nutritional disorder 08/01/2014   Chronic lumbar radiculopathy 06/17/2014   Chronic migraine 04/06/2014    Past Surgical History:  Procedure Laterality Date   ABDOMINAL HYSTERECTOMY     BACK SURGERY  2010   CARPAL TUNNEL RELEASE Left 08/22/2023   CESAREAN SECTION  2002   CHOLECYSTECTOMY  2010   COLONOSCOPY     ECTOPIC PREGNANCY SURGERY  2001, 2004   ESOPHAGOGASTRODUODENOSCOPY N/A 12/15/2023   Procedure: EGD (ESOPHAGOGASTRODUODENOSCOPY);  Surgeon: Maryruth Ole ONEIDA, MD;  Location: [facility] ENDOSCOPY;  Service: Endoscopy;  Laterality: N/A;   OOPHORECTOMY     OTHER SURGICAL HISTORY  11/2022   Left Eye Cataract   TOTAL LAPAROSCOPIC HYSTERECTOMY WITH SALPINGECTOMY  2014   TUBAL LIGATION  2004    OB History     Gravida  5   Para  3   Term  3   Preterm      AB  2   Living  3      SAB      IAB      Ectopic  2   Multiple      Live Births               Home Medications    Prior to Admission medications   Medication Sig Start Date End  Date Taking? Authorizing Provider  amoxicillin-clavulanate (AUGMENTIN) 875-125 MG tablet Take 1 tablet by mouth every 12 (twelve) hours for 7 days. 01/30/24 02/06/24 Yes Arvis Jolan NOVAK, PA-C  albuterol (VENTOLIN HFA) 108 (90 Base) MCG/ACT inhaler Inhale into the lungs every 6 (six) hours as needed for wheezing or shortness of breath. Before bed.    [provider]  Cetirizine HCl (ZYRTEC ALLERGY) 10 MG CAPS     [provider]  Cholecalciferol (VITAMIN D3) 2000 units capsule Take 2,000 Units by mouth. 4,000 daily.    [provider]  escitalopram (LEXAPRO) 5 MG tablet Take 5 mg by mouth daily.    [provider]  estradiol  (ESTRACE ) 0.5 MG tablet Take 1 tablet (0.5 mg total) by mouth daily. 01/27/24   Copland, Alicia B, PA-C  gabapentin (NEURONTIN) 300 MG capsule Take 1 capsule twice a day by oral  route. 07/22/23   [provider]  ibuprofen (ADVIL) 600 MG tablet Take 1 tablet 3 times a day by oral route. 09/17/23   [provider]  meloxicam  (MOBIC ) 15 MG tablet Take 1 tablet every day by oral route. 07/22/23   [provider]  montelukast (SINGULAIR) 10 MG tablet Take 10 mg by mouth daily. 10/27/21   [provider]  omeprazole (PRILOSEC) 10 MG capsule Take 20 mg by mouth daily.    [provider]  ondansetron (ZOFRAN-ODT) 4 MG disintegrating tablet Take one tablet po q 12 prn nausea. 09/17/23   [provider]  oxybutynin  (DITROPAN -XL) 10 MG 24 hr tablet Take 1 tablet (10 mg total) by mouth daily. 03/24/23   Gaston Hamilton, MD  SUMAtriptan (IMITREX) 50 MG tablet Reported on 08/18/2015 08/01/14   [provider]  SYMBICORT  160-4.5 MCG/ACT inhaler Inhale 2 puffs into the lungs 2 (two) times daily. 09/22/21   [provider]  topiramate (TOPAMAX) 50 MG tablet TAKE 1 TABLET (50 MG TOTAL) BY MOUTH ONCE DAILY. 05/20/15   [provider]  TURMERIC PO Take by mouth.    [provider]  VITAMIN K PO Take by mouth.    [provider]    Family History Family History  Problem Relation Age of Onset   Kidney failure Mother    Ovarian cancer Mother 58       gene neg   Breast cancer Paternal Aunt        pat great aunt   Breast cancer Other    Bladder Cancer Neg Hx    Kidney cancer Neg Hx    Prostate cancer Neg Hx    BRCA 1/2 Neg Hx     Social History Social History   Tobacco Use   Smoking status: Former   Smokeless tobacco: Never  Vaping Use   Vaping status: Never Used  Substance Use Topics   Alcohol use: Yes    Alcohol/week: 0.0 standard drinks of alcohol    Comment: occasionally   Drug use: No     Allergies   Latex, Other, Propoxyphene, Shellfish allergy, Poison ivy extract, Poison oak extract, and Poison sumac extract   Review of Systems Review of Systems  Constitutional:   Negative for fatigue and fever.  HENT:  Positive for congestion, ear pain and facial swelling. Negative for dental problem, rhinorrhea, sinus pressure, sinus pain, sore throat and trouble swallowing.   Respiratory:  Negative for cough.   Gastrointestinal:  Negative for nausea and vomiting.  Neurological:  Positive for headaches. Negative for dizziness and weakness.  Hematological:  Negative  for adenopathy.     Physical Exam Triage Vital Signs ED Triage Vitals  Encounter Vitals Group     BP 01/30/24 1508 119/82     Girls Systolic BP Percentile --      Girls Diastolic BP Percentile --      Boys Systolic BP Percentile --      Boys Diastolic BP Percentile --      Pulse Rate 01/30/24 1508 61     Resp 01/30/24 1508 15     Temp 01/30/24 1508 98.6 F (37 C)     Temp Source 01/30/24 1508 Oral     SpO2 01/30/24 1508 95 %     Weight 01/30/24 1507 206 lb (93.4 kg)     Height 01/30/24 1507 5' 1 (1.549 m)     Head Circumference --      Peak Flow --      Pain Score 01/30/24 1506 5     Pain Loc --      Pain Education --      Exclude from Growth Chart --    No data found.  Updated Vital Signs BP 119/82 (BP Location: Right Arm)   Pulse 61   Temp 98.6 F (37 C) (Oral)   Resp 15   Ht 5' 1 (1.549 m)   Wt 206 lb (93.4 kg)   LMP 07/10/2012   SpO2 95%   BMI 38.92 kg/m   Physical Exam Vitals and nursing note reviewed.  Constitutional:      General: She is not in acute distress.    Appearance: Normal appearance. She is not ill-appearing or toxic-appearing.  HENT:     Head: Normocephalic and atraumatic.     Comments: Swelling and tenderness over the right parotid    Right Ear: Ear canal and external ear normal. A middle ear effusion is present. Tympanic membrane is bulging.     Left Ear: Tympanic membrane, ear canal and external ear normal.     Nose: Congestion present.     Mouth/Throat:     Mouth: Mucous membranes are moist.     Dentition: No dental tenderness or dental  abscesses.     Pharynx: Oropharynx is clear.  Eyes:     General: No scleral icterus.       Right eye: No discharge.        Left eye: No discharge.     Conjunctiva/sclera: Conjunctivae normal.  Cardiovascular:     Rate and Rhythm: Normal rate and regular rhythm.     Heart sounds: Normal heart sounds.  Pulmonary:     Effort: Pulmonary effort is normal. No respiratory distress.     Breath sounds: Normal breath sounds.  Musculoskeletal:     Cervical back: Neck supple.  Skin:    General: Skin is dry.  Neurological:     General: No focal deficit present.     Mental Status: She is alert. Mental status is at baseline.     Motor: No weakness.     Gait: Gait normal.  Psychiatric:        Mood and Affect: Mood normal.        Behavior: Behavior normal.      UC Treatments / Results  Labs (all labs ordered are listed, but only abnormal results are displayed) Labs Reviewed - No data to display  EKG   Radiology No results found.  Procedures Procedures (including critical care time)  Medications Ordered in UC Medications - No data to display  Initial Impression /  Assessment and Plan / UC Course  I have reviewed the triage vital signs and the nursing notes.  Pertinent labs & imaging results that were available during my care of the patient were reviewed by me and considered in my medical decision making (see chart for details).   58 year old female presents for right sided facial/jaw pain since yesterday.  Has had some congestion over the past couple of weeks.  Denies dental pain or sore throat.  Increased pain in jaw and face with eating.  Symptoms started 15 minutes after eating yesterday.  Has been taking ibuprofen.  Presentation may be consistent with parotitis/sialadenitis.  Patient also has otitis media with effusion of the right ear.  Will treat at this time with Augmentin.  Advised to continue ibuprofen, Tylenol, warm compresses, increasing fluids and sucking on sour/hard  candies to stimulate flow of saliva.  Thoroughly reviewed return and ER precautions as well as PCP follow-up.   Final Clinical Impressions(s) / UC Diagnoses   Final diagnoses:  Parotid sialadenitis  Right acute serous otitis media, recurrence not specified     Discharge Instructions      - We discussed multiple different possibilities causing her pain.  Highest likelihood would be parotid gland inflammation which could be related to a stone or infection. - I sent antibiotics to the pharmacy to treat possible infection.  You also have a lot of fluid behind your ear so you should be using Flonase and a decongestant. - Take ibuprofen to help reduce inflammation and apply warm compresses to the face and jaw frequently throughout the day. - We also discussed sucking on hard sour candies to stimulate salivary flow and move a stone that could be lodged. - If at any point you develop fever, acute worsening of swelling or pain or have not improved in the next couple days need to seek reevaluation.  For significant worsening please go to the ER.   ED Prescriptions     Medication Sig Dispense Auth. Provider   amoxicillin-clavulanate (AUGMENTIN) 875-125 MG tablet Take 1 tablet by mouth every 12 (twelve) hours for 7 days. 14 tablet Santi Troung B, PA-C      PDMP not reviewed this encounter.   Arvis Jolan NOVAK, PA-C 01/30/24 1606

## 2024-01-30 NOTE — ED Triage Notes (Signed)
 Patient states that yesterday she started having right sided facial pain and swelling that runs along her jaw line.  Patient states that she took Motrin earlier this morning for pain.

## 2024-02-10 ENCOUNTER — Ambulatory Visit
Admission: RE | Admit: 2024-02-10 | Discharge: 2024-02-10 | Disposition: A | Discharge status: Home / Self Care | Source: Ambulatory Visit | Attending: Physician Assistant | Admitting: Physician Assistant

## 2024-02-10 ENCOUNTER — Encounter

## 2024-02-10 DIAGNOSIS — Z1231 Encounter for screening mammogram for malignant neoplasm of breast: Secondary | ICD-10-CM | POA: Diagnosis present

## 2024-02-13 ENCOUNTER — Encounter: Payer: Self-pay | Admitting: Emergency Medicine

## 2024-02-13 ENCOUNTER — Ambulatory Visit
Admission: EM | Admit: 2024-02-13 | Discharge: 2024-02-13 | Disposition: A | Discharge status: Home / Self Care | Attending: Physician Assistant | Admitting: Physician Assistant

## 2024-02-13 DIAGNOSIS — R197 Diarrhea, unspecified: Secondary | ICD-10-CM | POA: Diagnosis present

## 2024-02-13 DIAGNOSIS — A09 Infectious gastroenteritis and colitis, unspecified: Secondary | ICD-10-CM

## 2024-02-13 DIAGNOSIS — R5383 Other fatigue: Secondary | ICD-10-CM | POA: Diagnosis present

## 2024-02-13 LAB — POCT URINE DIPSTICK
Bilirubin, UA: NEGATIVE
Blood, UA: NEGATIVE
Glucose, UA: NEGATIVE mg/dL
Ketones, POC UA: NEGATIVE mg/dL
Leukocytes, UA: NEGATIVE
Nitrite, UA: NEGATIVE
POC PROTEIN,UA: NEGATIVE
Spec Grav, UA: 1.02 (ref 1.010–1.025)
Urobilinogen, UA: 0.2 U/dL
pH, UA: 5.5 (ref 5.0–8.0)

## 2024-02-13 NOTE — Discharge Instructions (Signed)
-   We have collected your stool today to check it for various bacteria and viruses.  I have high suspicion for C. difficile infection secondary to recent antibiotics you are on. - We will contact you and start you on the appropriate antibiotics. - At this time increase your fluids and rest.  Will hold off on giving any antidiarrhea medications as we do not want to trap any viruses or bacteria and cause worsening abdominal discomfort. - If at any point you develop a fever, severe abdominal pain or dehydration/weakness please go to the ER.  Otherwise, we will be in touch when we receive results in the next couple days.

## 2024-02-13 NOTE — ED Triage Notes (Signed)
 Patient reports diarrhea for 9 days.  Patient has tried OTC medicine for diarrhea and has not helped.  Patient was previously on antibiotics and also ate a pint of blueberries.  Patient denies blood in her stools.

## 2024-02-13 NOTE — ED Provider Notes (Signed)
 [facility]    CSN: [id number] Arrival date & time: 02/13/24  1752      History   Chief Complaint Chief Complaint  Patient presents with   Diarrhea    HPI Katrina Munoz is a 58 y.o. female presenting for 9-day history of diarrhea.  Patient was seen here on 11/7 for parotid sial adenitis and otitis media.  She was started on Augmentin  which she took for 10 days.  She states while taking the medication she developed diarrhea and has had 3-5 episodes of watery diarrhea a day.  She reports sometimes there will be soft formation to the stool.  She also reports loss of appetite and fatigue.  Denies nausea or vomiting.  Reports abdominal cramping which is intermittent.  Of note, she completed the antibiotics 4 days ago.  She has tried Kaopectate, Imodium and Pepto-Bismol all without any relief or improvement in her diarrhea.  She has history of IBS but says it usually responds to over-the-counter medications.  She also has history of GERD.  She denies IBD or diverticulitis history.  No associated fevers, dark or bloody stool.  She says she has been trying to push her fluids.  HPI  Past Medical History:  Diagnosis Date   Anxiety and depression    BRCA negative 10/2017   MyRisk neg except CDKN2A VUS; IBIS=9.3%   Family history of ovarian cancer    MyRisk neg   GERD (gastroesophageal reflux disease)    H/O nutritional disorder 08/01/2014   Headache, migraine 04/06/2014   Hemorrhoids    IBS (irritable bowel syndrome)    Leiomyoma of uterus    Lumbar radiculopathy 06/17/2014   Overview:  S/p bilateral lumbar decompression L4-5 and L5-S1 s/p excision of large central/left disc herniation 09/16/08 with resolution of symptoms recurrent in 2016 with dx of L5-S1 radiculopathy per Dr. ONEIDA Banks  [place], [facility]. Report scanned.    Menorrhagia    Stomach ulcer     Patient Active Problem List   Diagnosis Date Noted   Anxiety 12/11/2020   Nausea and vomiting in adult 01/03/2020   Right  knee pain 12/13/2019   Poison ivy 11/02/2018   Peroneal tendinitis 03/02/2018   Acute left ankle pain 02/03/2018   Stable angina pectoris 08/26/2016   Gastroesophageal reflux disease without esophagitis 08/01/2016   Hyperglycemia 08/01/2016   Family history of ovarian cancer 07/10/2016   Incontinence 09/11/2015   SUI (stress urinary incontinence, female) 09/11/2015   S/P hysterectomy 08/02/2015   H/O nutritional disorder 08/01/2014   Chronic lumbar radiculopathy 06/17/2014   Chronic migraine 04/06/2014    Past Surgical History:  Procedure Laterality Date   ABDOMINAL HYSTERECTOMY     BACK SURGERY  2010   CARPAL TUNNEL RELEASE Left 08/22/2023   CESAREAN SECTION  2002   CHOLECYSTECTOMY  2010   COLONOSCOPY     ECTOPIC PREGNANCY SURGERY  2001, 2004   ESOPHAGOGASTRODUODENOSCOPY N/A 12/15/2023   Procedure: EGD (ESOPHAGOGASTRODUODENOSCOPY);  Surgeon: Maryruth Ole ONEIDA, MD;  Location: [facility] ENDOSCOPY;  Service: Endoscopy;  Laterality: N/A;   OOPHORECTOMY     OTHER SURGICAL HISTORY  11/2022   Left Eye Cataract   TOTAL LAPAROSCOPIC HYSTERECTOMY WITH SALPINGECTOMY  2014   TUBAL LIGATION  2004    OB History     Gravida  5   Para  3   Term  3   Preterm      AB  2   Living  3      SAB  IAB      Ectopic  2   Multiple      Live Births               Home Medications    Prior to Admission medications   Medication Sig Start Date End Date Taking? Authorizing Provider  albuterol (VENTOLIN HFA) 108 (90 Base) MCG/ACT inhaler Inhale into the lungs every 6 (six) hours as needed for wheezing or shortness of breath. Before bed.    [provider]  Cetirizine HCl (ZYRTEC ALLERGY) 10 MG CAPS     [provider]  Cholecalciferol (VITAMIN D3) 2000 units capsule Take 2,000 Units by mouth. 4,000 daily.    [provider]  escitalopram (LEXAPRO) 5 MG tablet Take 5 mg by mouth daily.    [provider]  estradiol  (ESTRACE ) 0.5 MG tablet  Take 1 tablet (0.5 mg total) by mouth daily. 01/27/24   Copland, Alicia B, PA-C  gabapentin (NEURONTIN) 300 MG capsule Take 1 capsule twice a day by oral route. 07/22/23   [provider]  ibuprofen (ADVIL) 600 MG tablet Take 1 tablet 3 times a day by oral route. 09/17/23   [provider]  meloxicam  (MOBIC ) 15 MG tablet Take 1 tablet every day by oral route. 07/22/23   [provider]  montelukast (SINGULAIR) 10 MG tablet Take 10 mg by mouth daily. 10/27/21   [provider]  omeprazole (PRILOSEC) 10 MG capsule Take 20 mg by mouth daily.    [provider]  ondansetron (ZOFRAN-ODT) 4 MG disintegrating tablet Take one tablet po q 12 prn nausea. 09/17/23   [provider]  oxybutynin  (DITROPAN -XL) 10 MG 24 hr tablet Take 1 tablet (10 mg total) by mouth daily. 03/24/23   Gaston Hamilton, MD  SUMAtriptan (IMITREX) 50 MG tablet Reported on 08/18/2015 08/01/14   [provider]  SYMBICORT  160-4.5 MCG/ACT inhaler Inhale 2 puffs into the lungs 2 (two) times daily. 09/22/21   [provider]  topiramate (TOPAMAX) 50 MG tablet TAKE 1 TABLET (50 MG TOTAL) BY MOUTH ONCE DAILY. 05/20/15   [provider]  TURMERIC PO Take by mouth.    [provider]  VITAMIN K PO Take by mouth.    [provider]    Family History Family History  Problem Relation Age of Onset   Kidney failure Mother    Ovarian cancer Mother 23       gene neg   Breast cancer Paternal Aunt        pat great aunt   Breast cancer Other    Bladder Cancer Neg Hx    Kidney cancer Neg Hx    Prostate cancer Neg Hx    BRCA 1/2 Neg Hx     Social History Social History   Tobacco Use   Smoking status: Former   Smokeless tobacco: Never  Vaping Use   Vaping status: Never Used  Substance Use Topics   Alcohol use: Yes    Alcohol/week: 0.0 standard drinks of alcohol    Comment: occasionally   Drug use: No     Allergies   Latex, Other,  Propoxyphene, Shellfish allergy, Poison ivy extract, Poison oak extract, and Poison sumac extract   Review of Systems Review of Systems  Constitutional:  Positive for appetite change and fatigue.  Gastrointestinal:  Positive for abdominal pain and diarrhea. Negative for blood in stool, constipation, nausea and vomiting.  Genitourinary:  Negative for dysuria.  Musculoskeletal:  Negative for back pain.  Neurological:  Negative for weakness and headaches.     Physical Exam Triage Vital Signs ED Triage Vitals  Encounter Vitals Group     BP 02/13/24 1812 111/75     Girls Systolic BP Percentile --      Girls Diastolic BP Percentile --      Boys Systolic BP Percentile --      Boys Diastolic BP Percentile --      Pulse Rate 02/13/24 1812 67     Resp 02/13/24 1812 15     Temp 02/13/24 1812 98.6 F (37 C)     Temp Source 02/13/24 1812 Oral     SpO2 02/13/24 1812 96 %     Weight 02/13/24 1811 205 lb 14.6 oz (93.4 kg)     Height 02/13/24 1811 5' 1 (1.549 m)     Head Circumference --      Peak Flow --      Pain Score 02/13/24 1811 8     Pain Loc --      Pain Education --      Exclude from Growth Chart --    No data found.  Updated Vital Signs BP 111/75 (BP Location: Right Arm)   Pulse 67   Temp 98.6 F (37 C) (Oral)   Resp 15   Ht 5' 1 (1.549 m)   Wt 205 lb 14.6 oz (93.4 kg)   LMP 07/10/2012   SpO2 96%   BMI 38.91 kg/m   Physical Exam Vitals and nursing note reviewed.  Constitutional:      General: She is not in acute distress.    Appearance: Normal appearance. She is not ill-appearing or toxic-appearing.  HENT:     Head: Normocephalic and atraumatic.     Nose: Nose normal.     Mouth/Throat:     Mouth: Mucous membranes are moist.     Pharynx: Oropharynx is clear.  Eyes:     General: No scleral icterus.       Right eye: No discharge.        Left eye: No discharge.     Conjunctiva/sclera: Conjunctivae normal.  Cardiovascular:     Rate and Rhythm: Normal rate  and regular rhythm.     Heart sounds: Normal heart sounds.  Pulmonary:     Effort: Pulmonary effort is normal. No respiratory distress.     Breath sounds: Normal breath sounds.  Abdominal:     Palpations: Abdomen is soft.     Tenderness: There is no abdominal tenderness.  Musculoskeletal:     Cervical back: Neck supple.  Skin:    General: Skin is dry.  Neurological:     General: No focal deficit present.     Mental Status: She is alert. Mental status is at baseline.     Motor: No weakness.     Gait: Gait normal.  Psychiatric:        Mood and Affect: Mood normal.        Behavior: Behavior normal.      UC Treatments / Results  Labs (all labs ordered are listed, but only abnormal results are displayed) Labs Reviewed  C DIFFICILE QUICK SCREEN W PCR REFLEX    GASTROINTESTINAL PANEL BY PCR, STOOL (REPLACES STOOL CULTURE)  POCT URINE DIPSTICK    EKG   Radiology No results found.  Procedures Procedures (including critical care time)  Medications Ordered in UC Medications - No data to display  Initial Impression / Assessment and Plan / UC Course  I  have reviewed the triage vital signs and the nursing notes.  Pertinent labs & imaging results that were available during my care of the patient were reviewed by me and considered in my medical decision making (see chart for details).   58 year old female presents for diarrhea x 9 days.  Diarrhea began when she was taking Augmentin  for otitis media and sialoadenitis.  Reports 3-5 episodes of watery diarrhea per day with abdominal cramping and reduced appetite.  Vitals are all stable and normal.  Does not appear significantly ill.  Abdomen soft and nontender.  UA obtained today shows normal specific gravity and no evidence of infection.  Will have patient obtain stool for GI panel by PCR and C. difficile testing.  High suspicion for C. difficile.  In that event--treatment below: C-diff Initial infection: Oral:  Fidaxomicin/Dificid 200 mg twice daily for 10 days. If delayed response to treatment, a longer duration (eg, up to 14 days) may be considered.  At this time encouraged increasing rest and fluids.  Will treat as needed based on results.  If all results are negative and symptoms are persistent could be related to the antibiotics themselves which should gradually improve.  Advised making PCP follow-up especially if symptoms or not improving and also if she does receive treatment for something like C. difficile this should be followed up on as well.  Acute illness with systemic symptoms.   Final Clinical Impressions(s) / UC Diagnoses   Final diagnoses:  Diarrhea, unspecified type  Other fatigue     Discharge Instructions      - We have collected your stool today to check it for various bacteria and viruses.  I have high suspicion for C. difficile infection secondary to recent antibiotics you are on. - We will contact you and start you on the appropriate antibiotics. - At this time increase your fluids and rest.  Will hold off on giving any antidiarrhea medications as we do not want to trap any viruses or bacteria and cause worsening abdominal discomfort. - If at any point you develop a fever, severe abdominal pain or dehydration/weakness please go to the ER.  Otherwise, we will be in touch when we receive results in the next couple days.     ED Prescriptions   None    PDMP not reviewed this encounter.   Arvis Jolan NOVAK, PA-C 02/13/24 1947

## 2024-02-14 LAB — C DIFFICILE QUICK SCREEN W PCR REFLEX
C Diff antigen: POSITIVE — AB
C Diff toxin: NEGATIVE

## 2024-02-14 LAB — CLOSTRIDIUM DIFFICILE BY PCR, REFLEXED
Hypervirulent Strain: NEGATIVE
Toxigenic C. Difficile by PCR: POSITIVE — AB

## 2024-02-15 ENCOUNTER — Telehealth: Payer: Self-pay | Admitting: Emergency Medicine

## 2024-02-15 ENCOUNTER — Ambulatory Visit: Payer: Self-pay | Admitting: Physician Assistant

## 2024-02-15 LAB — GASTROINTESTINAL PANEL BY PCR, STOOL (REPLACES STOOL CULTURE)
Adenovirus F40/41: NOT DETECTED
Astrovirus: NOT DETECTED
Campylobacter species: NOT DETECTED
Cryptosporidium: NOT DETECTED
Cyclospora cayetanensis: NOT DETECTED
Entamoeba histolytica: NOT DETECTED
Enteroaggregative E coli (EAEC): NOT DETECTED
Enteropathogenic E coli (EPEC): DETECTED — AB
Enterotoxigenic E coli (ETEC): DETECTED — AB
Giardia lamblia: NOT DETECTED
Norovirus GI/GII: NOT DETECTED
Plesimonas shigelloides: NOT DETECTED
Rotavirus A: NOT DETECTED
Salmonella species: DETECTED — AB
Sapovirus (I, II, IV, and V): NOT DETECTED
Shiga like toxin producing E coli (STEC): NOT DETECTED
Shigella/Enteroinvasive E coli (EIEC): NOT DETECTED
Vibrio cholerae: NOT DETECTED
Vibrio species: NOT DETECTED
Yersinia enterocolitica: DETECTED — AB

## 2024-02-15 MED ORDER — CIPROFLOXACIN HCL 500 MG PO TABS: 500.0000 mg | ORAL_TABLET | Freq: Two times a day (BID) | ORAL | 0 refills | Status: AC

## 2024-02-15 MED ORDER — VANCOMYCIN HCL 125 MG PO CAPS: 125.0000 mg | ORAL_CAPSULE | Freq: Four times a day (QID) | ORAL | 0 refills | Status: AC

## 2024-02-15 NOTE — Telephone Encounter (Signed)
 Patient had positive stool culture results and also C-Diff Positive.  Jolan Host, PA was notified of results.

## 2024-03-22 ENCOUNTER — Ambulatory Visit: Payer: Self-pay | Admitting: Urology

## 2024-03-22 VITALS — BP 121/77 | HR 75 | Ht 61.0 in | Wt 210.0 lb

## 2024-03-22 DIAGNOSIS — N3946 Mixed incontinence: Secondary | ICD-10-CM | POA: Diagnosis not present

## 2024-03-22 LAB — URINALYSIS, COMPLETE
Bilirubin, UA: NEGATIVE
Glucose, UA: NEGATIVE
Ketones, UA: NEGATIVE
Leukocytes,UA: NEGATIVE
Nitrite, UA: NEGATIVE
Protein,UA: NEGATIVE
RBC, UA: NEGATIVE
Specific Gravity, UA: 1.02 (ref 1.005–1.030)
Urobilinogen, Ur: 0.2 mg/dL (ref 0.2–1.0)
pH, UA: 6 (ref 5.0–7.5)

## 2024-03-22 LAB — MICROSCOPIC EXAMINATION

## 2024-03-22 MED ORDER — OXYBUTYNIN CHLORIDE ER 10 MG PO TB24: 10.0000 mg | ORAL_TABLET | Freq: Every day | ORAL | 11 refills | Status: AC

## 2024-03-22 NOTE — Progress Notes (Signed)
 "  03/22/2024 9:55 AM   Katrina Munoz Like May 07, 1965 [id number]  Referring provider: Perri Constance Sor, PA-C 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 She leaks with coughing and sneezing and sometimes bending and lifting. There is no question she can leak without awareness. She also has urgency incontinence and denies enuresis and is failed Kegel exercises and Miraberon. She really started Vesicare in its helping some. She wears 3-4 pads a day sometimes damp sometimes moderately wet   The last time the patient was here she had a grade 2 hypermobility of the bladder neck and a positive cough test. She had a grade 1 rectocele.    On urodynamics the patient had a reduced bladder capacity with bladder overactivity and a mild bladder outlet abnormality   Felt that she had a moderate overactive bladder with a mild to moderate outlet abnormality. Felt both were significant. I gave her oxybutynin  last time. We then discussed the sling in detail with my usual template.   The patient is almost dry. Sometimes she leaks without awareness. If she gets a bad cold she will have stress incontinence. She truly thinks he oxybutynin  is helped a lot with less urgency and frequency.    Continues to be very happy with the oxybutynin  once a day.  Still has residual stress incontinence and does not wish to have surgery.  She is jogging a lot now and very happy.   Urine culture from last visit in May negative.  Never had CT scan.  Never saw a nurse practitioner.  Frequency is stable.  The patient's pain went away last time so she did not follow-up for get the x-ray.   In the last month she has higher volume urge incontinence with no cystitis symptoms.  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  The episodes can be high-volume.     Clinically  patient was not infected but I sent urine for culture.  She is has gained some weight and this may help explain her worsening urge incontinence.  The more I spoke to her she is reasonably happy on the oxybutynin .  I gave her the new beta 3 agonist with samples and co-pay card.  I hand-delivered oxybutynin .  If she fails the new medication she will go back on oxybutynin  and see me in 1 year.  I mentioned 3 refractory therapies but she is not can pursue them at this time   Today On regular days she is basically continent on the Gemtesa  unless she holds it too long.  She is very happy.  When she is sick with coughing sneezing she could use 5 pads a day.  No infections.  Does not want to pursue surgery since her father who is a diabetic is having issues with amputations and she is the primary caregiver   Today Frequency stable.  Mixed incontinence persisting.  She is already seeing physical therapy.  Again insurance would not cover the Gemtesa  which worked the best.  Clinically not infected and I will see her in 1 year.  Disease she had her sixth grandchild  renew oxy and see in one year  March 22, 2024 Frequency improved.  Urge incontinence much better.  Still on oxybutynin .  No infection   PMH: Past Medical History:  Diagnosis Date  Anxiety and depression    BRCA negative 10/2017   MyRisk neg except CDKN2A VUS; IBIS=9.3%   Family history of ovarian cancer    MyRisk neg   GERD (gastroesophageal reflux disease)    H/O nutritional disorder 08/01/2014   Headache, migraine 04/06/2014   Hemorrhoids    IBS (irritable bowel syndrome)    Leiomyoma of uterus    Lumbar radiculopathy 06/17/2014   Overview:  S/p bilateral lumbar decompression L4-5 and L5-S1 s/p excision of large central/left disc herniation 09/16/08 with resolution of symptoms recurrent in 2016 with dx of L5-S1 radiculopathy per Dr. ONEIDA Banks  [place], [facility]. Report scanned.    Menorrhagia    Stomach ulcer     Surgical History: Past  Surgical History:  Procedure Laterality Date   ABDOMINAL HYSTERECTOMY     BACK SURGERY  2010   CARPAL TUNNEL RELEASE Left 08/22/2023   CESAREAN SECTION  2002   CHOLECYSTECTOMY  2010   COLONOSCOPY     ECTOPIC PREGNANCY SURGERY  2001, 2004   ESOPHAGOGASTRODUODENOSCOPY N/A 12/15/2023   Procedure: EGD (ESOPHAGOGASTRODUODENOSCOPY);  Surgeon: Maryruth Ole ONEIDA, MD;  Location: [facility] ENDOSCOPY;  Service: Endoscopy;  Laterality: N/A;   OOPHORECTOMY     OTHER SURGICAL HISTORY  11/2022   Left Eye Cataract   TOTAL LAPAROSCOPIC HYSTERECTOMY WITH SALPINGECTOMY  2014   TUBAL LIGATION  2004    Home Medications:  Allergies as of 03/22/2024       Reactions   Latex Rash   Other Anaphylaxis   Uncoded Allergy. Allergen: DARVON Uncoded Allergy. Allergen: darvocet   Propoxyphene Anxiety, Other (See Comments)   Other reaction(s): Hallucination, Insomnia   Shellfish Allergy Shortness Of Breath   Poison Ivy Extract Dermatitis, Hives, Itching, Rash, Swelling   Poison Oak Extract Dermatitis, Hives, Itching, Rash, Swelling   Poison Sumac Extract Dermatitis, Hives, Itching, Rash, Swelling        Medication List        Accurate as of March 22, 2024  9:55 AM. If you have any questions, ask your nurse or doctor.          albuterol 108 (90 Base) MCG/ACT inhaler Commonly known as: VENTOLIN HFA Inhale into the lungs every 6 (six) hours as needed for wheezing or shortness of breath. Before bed.   escitalopram 5 MG tablet Commonly known as: LEXAPRO Take 5 mg by mouth daily.   estradiol  0.5 MG tablet Commonly known as: ESTRACE  Take 1 tablet (0.5 mg total) by mouth daily.   gabapentin 300 MG capsule Commonly known as: NEURONTIN Take 1 capsule twice a day by oral route.   ibuprofen 600 MG tablet Commonly known as: ADVIL Take 1 tablet 3 times a day by oral route.   meloxicam  15 MG tablet Commonly known as: MOBIC  Take 1 tablet every day by oral route.   montelukast 10 MG  tablet Commonly known as: SINGULAIR Take 10 mg by mouth daily.   omeprazole 10 MG capsule Commonly known as: PRILOSEC Take 20 mg by mouth daily.   ondansetron 4 MG disintegrating tablet Commonly known as: ZOFRAN-ODT Take one tablet po q 12 prn nausea.   oxybutynin  10 MG 24 hr tablet Commonly known as: DITROPAN -XL Take 1 tablet (10 mg total) by mouth daily.   SUMAtriptan 50 MG tablet Commonly known as: IMITREX Reported on 08/18/2015   Symbicort  160-4.5 MCG/ACT inhaler Generic drug: budesonide -formoterol  Inhale 2 puffs into the lungs 2 (two) times daily.   topiramate 50 MG tablet Commonly known as: TOPAMAX TAKE 1  TABLET (50 MG TOTAL) BY MOUTH ONCE DAILY.   TURMERIC PO Take by mouth.   Vitamin D3 50 MCG (2000 UT) capsule Take 2,000 Units by mouth. 4,000 daily.   VITAMIN K PO Take by mouth.   ZyrTEC Allergy 10 MG Caps Generic drug: Cetirizine HCl        Allergies: Allergies[1]  Family History: Family History  Problem Relation Age of Onset   Kidney failure Mother    Ovarian cancer Mother 67       gene neg   Breast cancer Paternal Aunt        pat great aunt   Breast cancer Other    Bladder Cancer Neg Hx    Kidney cancer Neg Hx    Prostate cancer Neg Hx    BRCA 1/2 Neg Hx     Social History:  reports that she has quit smoking. She has never used smokeless tobacco. She reports current alcohol use. She reports that she does not use drugs.  ROS:                                        Physical Exam: LMP 07/10/2012   Constitutional:  Alert and oriented, No acute distress.   Laboratory Data: Lab Results  Component Value Date   WBC 8.7 01/25/2013   HGB 9.4 (L) 02/10/2013   HCT 37.7 01/25/2013   MCV 92 01/25/2013   PLT 332 01/25/2013    Lab Results  Component Value Date   CREATININE 0.73 01/25/2013    No results found for: PSA  No results found for: TESTOSTERONE  Lab Results  Component Value Date   HGBA1C 5.1  09/04/2017    Urinalysis    Component Value Date/Time   APPEARANCEUR Clear 03/24/2023 1017   GLUCOSEU Negative 03/24/2023 1017   BILIRUBINUR negative 02/13/2024 1825   BILIRUBINUR Negative 03/24/2023 1017   KETONESUR negative 02/13/2024 1825   PROTEINUR Negative 03/24/2023 1017   UROBILINOGEN 0.2 02/13/2024 1825   NITRITE Negative 02/13/2024 1825   NITRITE Negative 03/24/2023 1017   LEUKOCYTESUR Negative 02/13/2024 1825   LEUKOCYTESUR Negative 03/24/2023 1017    Pertinent Imaging:   Assessment & Plan: Oxybutynin  renewed 30 x 11 and see in 1 year  1. Mixed incontinence (Primary)  - Urinalysis, Complete   No follow-ups on file.  Katrina DELENA Elizabeth, MD  [facility] [address] [place], [place] [address] [phone]     [1]  Allergies Allergen Reactions   Latex Rash   Other Anaphylaxis    Uncoded Allergy. Allergen: DARVON Uncoded Allergy. Allergen: darvocet   Propoxyphene Anxiety and Other (See Comments)    Other reaction(s): Hallucination, Insomnia   Shellfish Allergy Shortness Of Breath   Poison Ivy Extract Dermatitis, Hives, Itching, Rash and Swelling   Poison Oak Extract Dermatitis, Hives, Itching, Rash and Swelling   Poison Sumac Extract Dermatitis, Hives, Itching, Rash and Swelling   "

## 2024-03-29 LAB — MISCELLANEOUS TEST

## 2025-03-21 ENCOUNTER — Ambulatory Visit: Admitting: Urology
